# Patient Record
Sex: Female | Born: 1985
Health system: Southern US, Community
[De-identification: ages and names within clinical notes are randomized; demographics above are authoritative.]

## PROBLEM LIST (undated history)

## (undated) DIAGNOSIS — K589 Irritable bowel syndrome without diarrhea: Secondary | ICD-10-CM

## (undated) DIAGNOSIS — F32A Depression, unspecified: Secondary | ICD-10-CM

## (undated) DIAGNOSIS — F329 Major depressive disorder, single episode, unspecified: Secondary | ICD-10-CM

## (undated) HISTORY — DX: Irritable bowel syndrome, unspecified: K58.9

## (undated) HISTORY — DX: Depression, unspecified: F32.A

## (undated) HISTORY — DX: Major depressive disorder, single episode, unspecified: F32.9

## (undated) HISTORY — PX: COLPOSCOPY: SHX161

---

## 2009-08-30 ENCOUNTER — Ambulatory Visit: Payer: Self-pay | Admitting: Obstetrics and Gynecology

## 2009-09-19 ENCOUNTER — Ambulatory Visit: Payer: Self-pay | Admitting: Obstetrics & Gynecology

## 2009-09-19 ENCOUNTER — Other Ambulatory Visit: Admission: RE | Admit: 2009-09-19 | Discharge: 2009-09-19 | Payer: Self-pay | Admitting: Obstetrics and Gynecology

## 2009-10-10 ENCOUNTER — Ambulatory Visit: Payer: Self-pay | Admitting: Obstetrics and Gynecology

## 2010-04-10 ENCOUNTER — Ambulatory Visit: Payer: Self-pay | Admitting: Obstetrics and Gynecology

## 2010-09-30 ENCOUNTER — Other Ambulatory Visit: Payer: Self-pay | Admitting: Physician Assistant

## 2010-09-30 ENCOUNTER — Ambulatory Visit: Payer: Self-pay | Admitting: Occupational Therapy

## 2010-09-30 DIAGNOSIS — R87619 Unspecified abnormal cytological findings in specimens from cervix uteri: Secondary | ICD-10-CM

## 2010-11-08 NOTE — Progress Notes (Signed)
Monica Meyer, SON NO.:  [id number]  MEDICAL RECORD NO.:  [id number]           PATIENT TYPE:  A  LOCATION:  [facility]                   FACILITY:  [facility]  PHYSICIAN:  Maylon Cos, CNM    DATE OF BIRTH:  08-12-1986  DATE OF SERVICE:                                 CLINIC NOTE  [name] is a 25 year old G1, P0-0-1-0.  She was here to have a repeat Pap status post LEEP procedure.  This is her second Pap.  She denies any other pains or problems.  Denies any discharge or bleeding, is otherwise states that she is healthy.  PHYSICAL EXAMINATION:  GU:  Shows normal rugae, normal pink cervix with mild friability, normal discharge. HEART:  Normal. LUNGS:  Normal. VITAL SIGNS:  Temperature 98.9, pulse 101, blood pressure 114/74, weight 110.9.  ASSESSMENT AND PLAN:  This is a 25 year old G1, P0-0-1-0 patient who is here to repeat her Pap.  Last Pap was negative.  She is to return in 6 months to review the result of this Pap and/or get a third Pap.  Vitamin supplement with folic acid were also discussed with the patient.  The patient will continue Depo shot as discussed.    ______________________________ Maylon Cos, CNM   ______________________________ Maylon Cos, CNM   SS/MEDQ  D:  09/30/2010  T:  10/01/2010  Job:  [id number]

## 2011-03-13 ENCOUNTER — Ambulatory Visit: Payer: Self-pay | Admitting: Obstetrics & Gynecology

## 2014-06-06 ENCOUNTER — Encounter: Payer: Self-pay | Admitting: Family Medicine

## 2014-06-06 ENCOUNTER — Ambulatory Visit (INDEPENDENT_AMBULATORY_CARE_PROVIDER_SITE_OTHER): Payer: BC Managed Care – PPO | Admitting: Family Medicine

## 2014-06-06 VITALS — BP 122/68 | HR 76 | Temp 98.2°F | Resp 14 | Ht 63.0 in | Wt 112.0 lb

## 2014-06-06 DIAGNOSIS — F329 Major depressive disorder, single episode, unspecified: Secondary | ICD-10-CM | POA: Insufficient documentation

## 2014-06-06 DIAGNOSIS — G4700 Insomnia, unspecified: Secondary | ICD-10-CM

## 2014-06-06 DIAGNOSIS — F411 Generalized anxiety disorder: Secondary | ICD-10-CM

## 2014-06-06 DIAGNOSIS — F331 Major depressive disorder, recurrent, moderate: Secondary | ICD-10-CM

## 2014-06-06 DIAGNOSIS — F5104 Psychophysiologic insomnia: Secondary | ICD-10-CM | POA: Insufficient documentation

## 2014-06-06 MED ORDER — ZOLPIDEM TARTRATE 10 MG PO TABS: 10.0000 mg | ORAL_TABLET | Freq: Every evening | ORAL | Status: DC | PRN

## 2014-06-06 MED ORDER — ESCITALOPRAM OXALATE 10 MG PO TABS: 10.0000 mg | ORAL_TABLET | Freq: Every day | ORAL | Status: DC

## 2014-06-06 NOTE — Progress Notes (Signed)
Patient ID: Monica Meyer, female   DOB: 1986-05-17, 28 y.o.   MRN: [id number]   Subjective:    Patient ID: Monica Meyer, female    DOB: 1986-05-17, 28 y.o.   MRN: [id number]  Patient presents for Increased Anxiety/ Stress  patient here to discuss depression and anxiety. She was initially diagnosed with anxiety and depression at the age of 28 or 5017 that time she was put on Zoloft she thinks. She went off the medication because she was pregnant however she had an abortion. She was seen by therapist over the past few years but never felt that this helps. Her mother also suffers with depression. Over the past couple years she has had depression and anxiety which has been untreated. More recently she's had episodes of chest discomfort when she gets stressed out she denies any overt panic attacks actually did not do the definition of this. She also has very poor sleep and typically takes Tylenol PM as needed. She will like to try medications to help with her anxiety and depression. She is fearful of being alone and she is currently 28 years old no children and not in a stable relationship. She also has a lot of stress at her job which she works at [employer] and she still lives with her mother. She denies hallucinations or suicidal ideations. She does exercise on a regular basis to help with her overall health On her chest hurts she will typically have discomfort that feels more like a tightness that lasts all day long until she goes to sleep. She's not had any acid reflux no nausea vomiting associated no shortness of breath no upper respiratory symptoms  [facility] Review Of Systems:  GEN- denies fatigue, fever, weight loss,weakness, recent illness HEENT- denies eye drainage, change in vision, nasal discharge, CVS-+chest pain, palpitations RESP- denies SOB, cough, wheeze ABD- denies N/V, change in stools, abd pain GU- denies dysuria, hematuria, dribbling, incontinence MSK- denies  joint pain, muscle aches, injury Neuro- denies headache, dizziness, syncope, seizure activity       Objective:    BP 122/68  Pulse 76  Temp(Src) 98.2 F (36.8 C) (Oral)  Resp 14  Ht 5\' 3"  (1.6 m)  Wt 112 lb (50.803 kg)  BMI 19.84 kg/m2 GEN- NAD, alert and oriented x3 HEENT- PERRL, EOMI, non injected sclera, pink conjunctiva, MMM, oropharynx clear Neck- Supple, no thyromegaly CVS- RRR, no murmur RESP-CTAB ABD-NABS,soft,NT,ND Psych- normal affect and mood, no SI, normal speech,well groomed EXT- No edema Pulses- Radial 2+        Assessment & Plan:      Problem List Items Addressed This Visit   None      Note: This dictation was prepared with Dragon dictation along with smaller phrase technology. Any transcriptional errors that result from this process are unintentional.

## 2014-06-06 NOTE — Assessment & Plan Note (Signed)
We'll start her on Lexapro 10 mg once a day. We discussed the side effects of the medication. I think she'll benefit from treatment. She declines going to therapy at this time as it has not helped in the past. I see no red flags on examination today.  She does drink alcohol we did discuss not taking his medications with alcohol

## 2014-06-06 NOTE — Assessment & Plan Note (Signed)
Per above Also give her Ambien as needed for sleep until the Lexapro gets in her system

## 2014-06-06 NOTE — Patient Instructions (Signed)
Start the lexapro once a day  Take ambein as needed  F/U 4 weeks

## 2014-06-14 ENCOUNTER — Telehealth: Payer: Self-pay | Admitting: Physician Assistant

## 2014-06-14 MED ORDER — SERTRALINE HCL 50 MG PO TABS: 50.0000 mg | ORAL_TABLET | Freq: Every day | ORAL | Status: DC

## 2014-06-14 NOTE — Telephone Encounter (Signed)
D/c the lexpro Wait 1 week, then start zoloft 50mg  once a day, she has taken in the  Past should not have these effects

## 2014-06-14 NOTE — Telephone Encounter (Signed)
Prescription sent to pharmacy.   Call placed to patient. LMTRC.  

## 2014-06-14 NOTE — Telephone Encounter (Signed)
Patient returned call and made aware.

## 2014-06-14 NOTE — Telephone Encounter (Signed)
Call placed to patient to inquire.  Reports that she began taking Lexapro on 06/07/2014. States that she began taking it at bedtime in case it made her sleepy or dizzy. Reports that she did note some dizziness after taking it, but the dizziness continued throughout the next day.   Also noted that she had some mouth and tongue numbness, but denied any SOB or difficulty breathing. States that she continues to have mild chest pain, but she was having the chest pain before she began medication.   States that today she feels nauseous and has abdominal cramping as well.   MD please advise.

## 2014-06-14 NOTE — Telephone Encounter (Signed)
[id number] Patient is calling to let you know that the generic lexapro that we prescribed for her is making her feel horrible  Would like a call to see what she can do

## 2014-07-11 ENCOUNTER — Ambulatory Visit: Payer: BC Managed Care – PPO | Admitting: Family Medicine

## 2014-11-24 ENCOUNTER — Ambulatory Visit
Admission: RE | Admit: 2014-11-24 | Discharge: 2014-11-24 | Disposition: A | Discharge status: Home / Self Care | Payer: BLUE CROSS/BLUE SHIELD | Source: Ambulatory Visit | Attending: Family Medicine | Admitting: Family Medicine

## 2014-11-24 ENCOUNTER — Encounter: Payer: Self-pay | Admitting: Family Medicine

## 2014-11-24 ENCOUNTER — Ambulatory Visit (INDEPENDENT_AMBULATORY_CARE_PROVIDER_SITE_OTHER): Payer: BLUE CROSS/BLUE SHIELD | Admitting: Family Medicine

## 2014-11-24 DIAGNOSIS — M545 Low back pain, unspecified: Secondary | ICD-10-CM

## 2014-11-24 DIAGNOSIS — M546 Pain in thoracic spine: Secondary | ICD-10-CM

## 2014-11-24 MED ORDER — MELOXICAM 7.5 MG PO TABS: 7.5000 mg | ORAL_TABLET | Freq: Every day | ORAL | Status: DC

## 2014-11-24 MED ORDER — CYCLOBENZAPRINE HCL 5 MG PO TABS: 5.0000 mg | ORAL_TABLET | Freq: Three times a day (TID) | ORAL | Status: DC | PRN

## 2014-11-24 NOTE — Patient Instructions (Signed)
Take anti-inflammatory Take muscle relaxer Okay to use heating pad Get xrays done Try Melatonin as  needed F/U in 3-4 weeks if not improved
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
## 2014-11-27 ENCOUNTER — Encounter: Payer: Self-pay | Admitting: Physician Assistant

## 2014-12-01 ENCOUNTER — Telehealth: Payer: Self-pay | Admitting: Physician Assistant

## 2014-12-01 NOTE — Telephone Encounter (Signed)
Okay to return on Monday, she needs to start slow and build back up, go easy on weights
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
## 2014-12-01 NOTE — Telephone Encounter (Signed)
Pt called with provider recommendations 

## 2014-12-04 ENCOUNTER — Telehealth: Payer: Self-pay | Admitting: Physician Assistant

## 2014-12-04 NOTE — Telephone Encounter (Signed)
Patient brought fmla forms in to be filled out on 12/04/2014
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
## 2014-12-06 NOTE — Telephone Encounter (Signed)
Received FMLA ppw on my desk, called pt vm not set up, I need to know what the hours pt works and job duties

## 2014-12-07 NOTE — Telephone Encounter (Signed)
Received PPW back on my desk, called pt to make aware of FMLA is ready to be faxed or picked up and to pay fee of 20 dollars I asked pt if she wanted forms to be faxed and stated yes, I informed her that she had to pay the fee first before I can fax over and she stated that she was at work and she will call later to pay, I told her that was fine but can not fax until I know that she has paid.Pt will call back to make pament

## 2014-12-13 ENCOUNTER — Telehealth: Payer: Self-pay | Admitting: Physician Assistant

## 2014-12-13 ENCOUNTER — Other Ambulatory Visit: Payer: Self-pay | Admitting: Family Medicine

## 2014-12-13 NOTE — Telephone Encounter (Signed)
Refill appropriate and filled per protocol. 

## 2014-12-13 NOTE — Telephone Encounter (Signed)
Patient is just letting dr Jeanice Limdurham know that her back was hurting today, she said it was getting better but today is different.  Would like for you to call her if any questions to [phone]

## 2014-12-13 NOTE — Telephone Encounter (Signed)
Will forward to Dr. Jeanice Limurham. She saw pt for LOV, which was for this problem.  I dont know why my name is attached to last few phone messages--I wasnot involve in those.

## 2014-12-13 NOTE — Telephone Encounter (Signed)
Agree with above, xrays negative, she can use meds, heat

## 2014-12-13 NOTE — Telephone Encounter (Signed)
Returned call to patient.   Reports that she went about x1 week without back pain. She stopped taking Flexeril and Mobic.   States that she did more lifting while at work on 12/12/2014, and woke up this morning with pain in her back.   Advised to resume QD Mobic and PRN Flexeril. Advised to F/U with phone call if pain is not relieved.   MD to be made aware.

## 2015-01-01 ENCOUNTER — Encounter: Payer: Self-pay | Admitting: Physician Assistant

## 2015-02-27 ENCOUNTER — Encounter: Payer: Self-pay | Admitting: Physician Assistant

## 2015-11-26 DIAGNOSIS — F339 Major depressive disorder, recurrent, unspecified: Secondary | ICD-10-CM | POA: Diagnosis not present

## 2015-11-26 DIAGNOSIS — G4700 Insomnia, unspecified: Secondary | ICD-10-CM | POA: Diagnosis not present
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
## 2016-01-23 DIAGNOSIS — Z3042 Encounter for surveillance of injectable contraceptive: Secondary | ICD-10-CM | POA: Diagnosis not present

## 2016-01-23 DIAGNOSIS — Z3009 Encounter for other general counseling and advice on contraception: Secondary | ICD-10-CM | POA: Diagnosis not present

## 2016-03-04 DIAGNOSIS — Z01419 Encounter for gynecological examination (general) (routine) without abnormal findings: Secondary | ICD-10-CM | POA: Diagnosis not present

## 2016-03-04 DIAGNOSIS — Z3009 Encounter for other general counseling and advice on contraception: Secondary | ICD-10-CM | POA: Diagnosis not present

## 2016-03-04 DIAGNOSIS — Z3042 Encounter for surveillance of injectable contraceptive: Secondary | ICD-10-CM | POA: Diagnosis not present

## 2016-04-14 ENCOUNTER — Ambulatory Visit: Payer: Self-pay | Admitting: Physician Assistant

## 2016-04-15 DIAGNOSIS — Z3042 Encounter for surveillance of injectable contraceptive: Secondary | ICD-10-CM | POA: Diagnosis not present

## 2016-04-15 DIAGNOSIS — Z3009 Encounter for other general counseling and advice on contraception: Secondary | ICD-10-CM | POA: Diagnosis not present

## 2016-09-15 ENCOUNTER — Telehealth: Payer: Self-pay | Admitting: Family Medicine

## 2016-09-15 NOTE — Telephone Encounter (Signed)
Pt states her lymph node was swollen but it is not now, wants to know if it happens again if she should come in. Please call pt back [phone].

## 2016-09-15 NOTE — Telephone Encounter (Signed)
Call placed to patient. No answer. No VM.  

## 2016-09-16 NOTE — Telephone Encounter (Signed)
Call placed to patient. No answer. No VM.  

## 2016-09-18 NOTE — Telephone Encounter (Signed)
Multiple calls placed to patient with no answer and no return call.   Message to be closed.  

## 2016-10-13 DIAGNOSIS — Z3042 Encounter for surveillance of injectable contraceptive: Secondary | ICD-10-CM | POA: Diagnosis not present

## 2016-10-13 DIAGNOSIS — Z3009 Encounter for other general counseling and advice on contraception: Secondary | ICD-10-CM | POA: Diagnosis not present

## 2017-01-13 DIAGNOSIS — Z3009 Encounter for other general counseling and advice on contraception: Secondary | ICD-10-CM | POA: Diagnosis not present

## 2017-01-13 DIAGNOSIS — Z3042 Encounter for surveillance of injectable contraceptive: Secondary | ICD-10-CM | POA: Diagnosis not present

## 2017-02-02 ENCOUNTER — Ambulatory Visit (INDEPENDENT_AMBULATORY_CARE_PROVIDER_SITE_OTHER): Payer: BLUE CROSS/BLUE SHIELD | Admitting: Physician Assistant

## 2017-02-02 ENCOUNTER — Encounter: Payer: Self-pay | Admitting: Physician Assistant

## 2017-02-02 VITALS — BP 102/78 | HR 60 | Temp 98.3°F | Resp 14 | Ht 63.0 in | Wt 116.8 lb

## 2017-02-02 DIAGNOSIS — R630 Anorexia: Secondary | ICD-10-CM

## 2017-02-02 DIAGNOSIS — R6881 Early satiety: Secondary | ICD-10-CM

## 2017-02-02 DIAGNOSIS — R197 Diarrhea, unspecified: Secondary | ICD-10-CM | POA: Diagnosis not present

## 2017-02-02 LAB — CBC WITH DIFFERENTIAL/PLATELET
BASOS ABS: 0 {cells}/uL (ref 0–200)
BASOS PCT: 0 %
EOS PCT: 3 %
Eosinophils Absolute: 177 cells/uL (ref 15–500)
HCT: 40.7 % (ref 35.0–45.0)
HEMOGLOBIN: 13.5 g/dL (ref 12.0–15.0)
Lymphocytes Relative: 37 %
Lymphs Abs: 2183 cells/uL (ref 850–3900)
MCH: 29 pg (ref 27.0–33.0)
MCHC: 33.2 g/dL (ref 32.0–36.0)
MCV: 87.3 fL (ref 80.0–100.0)
MONOS PCT: 5 %
MPV: 8.5 fL (ref 7.5–12.5)
Monocytes Absolute: 295 cells/uL (ref 200–950)
NEUTROS ABS: 3245 {cells}/uL (ref 1500–7800)
Neutrophils Relative %: 55 %
Platelets: 310 10*3/uL (ref 140–400)
RBC: 4.66 MIL/uL (ref 3.80–5.10)
RDW: 13.2 % (ref 11.0–15.0)
WBC: 5.9 10*3/uL (ref 3.8–10.8)

## 2017-02-02 NOTE — Progress Notes (Addendum)
Patient ID: Monica SnellenBrionni Macht MRN: [id number], DOB: 10/12/1985, 31 y.o. Date of Encounter: 02/02/2017, 11:56 AM    Chief Complaint:  Chief Complaint  Patient presents with  . trouble eating    x2 months     HPI: 31 y.o. year old female presents with above.  I reviewed her office visit note from 06/06/14 which discusses her history of anxiety and depression. See that note for further information regarding that.  Today she states that she has been noticing the following symptoms for almost 2 months now: Says that she "has always been underweight". Says that at one point recently when she went back for her Depo Provera shot she had gained 10 pounds since the prior depo shot 3 months prior. Did not know why or how she had gained that weight. However now feels like she is probably losing some of that weight again because she has had decreased appetite.  Says that "they will order me 5 chicken nuggets and I will eat 2 of them and just can't eat anymore ". Says that she is not trying to lose weight. Asked if she feels that her anxiety or depression are affecting this.  She says that in the past if her anxiety or depression kicked in, then she would not want to eat-- but that would only last a couple of days and this is lasting a lot longer. Also she says that she is not feeling like she is having any increased anxiety or depression recently. Says that she is actually been laughing, etc. Says that when she eats it causes no pain. Says that she "gets that feeling like she just can't eat another bite ". Says it is happens with everything-- every type of food -- she feels this way.  Says that she has been on the Depo-Provera for over 7 years. The only other medicine she uses-- occasional ibuprofen and then Tylenol PM. Uses some over-the-counter medication for stuffy runny nose occasionally. No other medications no new medication changes.  She has no constipation. No hard stools no  straining. Says that on a regular basis generally her stools will be a mix of, regular and loose. However says that recently all stools have been diarrhea but still it's less than or equal to 2 per day.  She has had no abdominal pain. Is not having belching or a lot of gas.  No fevers or chills. No other symptoms. No other concerns to address today.     Home Meds:   Outpatient Medications Prior to Visit  Medication Sig Dispense Refill  . medroxyPROGESTERone (DEPO-PROVERA) 150 MG/ML injection Inject 150 mg into the muscle every 3 (three) months.    . cyclobenzaprine (FLEXERIL) 5 MG tablet Take 1 tablet (5 mg total) by mouth 3 (three) times daily as needed for muscle spasms. 30 tablet 1  . meloxicam (MOBIC) 7.5 MG tablet TAKE ONE TABLET BY MOUTH DAILY 30 tablet 3   No facility-administered medications prior to visit.     Allergies: No Known Allergies    Review of Systems: See HPI for pertinent ROS. All other ROS negative.    Physical Exam: Blood pressure 102/78, pulse 60, temperature 98.3 F (36.8 C), temperature source Oral, resp. rate 14, height 5\' 3"  (1.6 m), weight 116 lb 12.8 oz (53 kg)., Body mass index is 20.69 kg/m. General:  WNWD AAF. Appears in no acute distress. Neck: Supple. No thyromegaly. No lymphadenopathy. Lungs: Clear bilaterally to auscultation without wheezes, rales, or rhonchi. Breathing is  unlabored. Heart: Regular rhythm. No murmurs, rubs, or gallops. Abdomen: Soft, non-tender, non-distended with normoactive bowel sounds. No hepatomegaly. No rebound/guarding. No obvious abdominal masses. There is no area of tenderness with palpation. Msk:  Strength and tone normal for age. Extremities/Skin: Warm and dry.  Neuro: Alert and oriented X 3. Moves all extremities spontaneously. Gait is normal. CNII-XII grossly in tact. Psych:  Responds to questions appropriately with a normal affect.     ASSESSMENT AND PLAN:  31 y.o. year old female with   Obtain labs. Will  follow-up with her once a get these results. If these provide no answers then will refer to GI.  1. Anorexia Obtain labs. Will follow-up with her once a get these results. If these provide no answers then will refer to GI. - CBC with Differential/Platelet - COMPLETE METABOLIC PANEL WITH GFR - TSH - hCG, serum, qualitative - Celiac panel 10 - Ambulatory referral to Gastroenterology - HIV antibody  2. Early satiety - CBC with Differential/Platelet - COMPLETE METABOLIC PANEL WITH GFR - hCG, serum, qualitative - Celiac panel 10 - Ambulatory referral to Gastroenterology - HIV antibody  3. Diarrhea, unspecified type - CBC with Differential/Platelet - COMPLETE METABOLIC PANEL WITH GFR - Celiac panel 10 - Ambulatory referral to Gastroenterology - HIV antibody  --------------------------------------------ADDENDUM-----RECEIVED OV NOTE FROM DR. HUNG, GI---------------------------------------------- His assessment is irritable bowel syndrome with diarrhea. He felt that her symptoms were consistent with IBS. He plan to try her on Viberzi 100 g twice a day to see if this would help with her morning abdominal pain and diarrhea. He would reassess in one month. Also noted that he had the following information and his HPI--" when she lost her job, interestingly, her symptoms improved. She does notice that stress does worsen her symptoms. In the morning her abdominal symptoms improved with a bowel movement and it is diarrhea."   Signed, [address] [place], [place], [facility] 02/02/2017 11:56 AM

## 2017-02-03 LAB — HCG, SERUM, QUALITATIVE: Preg, Serum: NEGATIVE

## 2017-02-03 LAB — COMPLETE METABOLIC PANEL WITH GFR
ALT: 10 U/L (ref 6–29)
AST: 13 U/L (ref 10–30)
Albumin: 4.6 g/dL (ref 3.6–5.1)
Alkaline Phosphatase: 52 U/L (ref 33–115)
BILIRUBIN TOTAL: 0.6 mg/dL (ref 0.2–1.2)
BUN: 14 mg/dL (ref 7–25)
CO2: 25 mmol/L (ref 20–31)
Calcium: 9.3 mg/dL (ref 8.6–10.2)
Chloride: 107 mmol/L (ref 98–110)
Creat: 0.9 mg/dL (ref 0.50–1.10)
GFR, EST NON AFRICAN AMERICAN: 86 mL/min (ref >=60)
GFR, Est African American: >89 mL/min (ref >=60)
Glucose, Bld: 83 mg/dL (ref 70–99)
Potassium: 4.5 mmol/L (ref 3.5–5.3)
SODIUM: 139 mmol/L (ref 135–146)
Total Protein: 7.2 g/dL (ref 6.1–8.1)

## 2017-02-03 LAB — HIV ANTIBODY (ROUTINE TESTING W REFLEX): HIV 1&2 Ab, 4th Generation: NONREACTIVE

## 2017-02-03 LAB — TSH: TSH: 1.44 mIU/L

## 2017-02-06 LAB — CELIAC PNL 2 RFLX ENDOMYSIAL AB TTR
(tTG) Ab, IgA: <1 U/mL
(tTG) Ab, IgG: 4 U/mL
ENDOMYSIAL AB IGA: NEGATIVE
Gliadin(Deam) Ab,IgA: 18 U (ref <20)
Gliadin(Deam) Ab,IgG: 4 U (ref <20)
IMMUNOGLOBULIN A: 300 mg/dL (ref 81–463)

## 2017-02-23 DIAGNOSIS — K580 Irritable bowel syndrome with diarrhea: Secondary | ICD-10-CM | POA: Diagnosis not present

## 2017-04-22 DIAGNOSIS — Z3042 Encounter for surveillance of injectable contraceptive: Secondary | ICD-10-CM | POA: Diagnosis not present

## 2017-04-30 DIAGNOSIS — Z3042 Encounter for surveillance of injectable contraceptive: Secondary | ICD-10-CM | POA: Diagnosis not present

## 2017-04-30 DIAGNOSIS — Z3009 Encounter for other general counseling and advice on contraception: Secondary | ICD-10-CM | POA: Diagnosis not present

## 2017-04-30 DIAGNOSIS — Z3200 Encounter for pregnancy test, result unknown: Secondary | ICD-10-CM | POA: Diagnosis not present

## 2017-05-21 ENCOUNTER — Ambulatory Visit (INDEPENDENT_AMBULATORY_CARE_PROVIDER_SITE_OTHER): Payer: BLUE CROSS/BLUE SHIELD

## 2017-05-21 ENCOUNTER — Ambulatory Visit (INDEPENDENT_AMBULATORY_CARE_PROVIDER_SITE_OTHER): Payer: BLUE CROSS/BLUE SHIELD | Admitting: Orthopaedic Surgery

## 2017-05-21 DIAGNOSIS — M25561 Pain in right knee: Secondary | ICD-10-CM

## 2017-05-21 DIAGNOSIS — G8929 Other chronic pain: Secondary | ICD-10-CM

## 2017-05-21 NOTE — Progress Notes (Signed)
Office Visit Note   Patient: Monica Meyer           Date of Birth: 1986-01-12           MRN: [id number] Visit Date: 05/21/2017              Requested by: Dorena Bodo, PA-C [address], [place] [address] PCP: Dorena Bodo, PA-C   Assessment & Plan: Visit Diagnoses:  1. Chronic pain of right knee     Plan: For now the only thing I would suggest would be quad strengthening exercises as well as occasional over-the-counter anti-inflammatories or even glucosamine. We went over her x-rays and knee model and explained in detail the rationale but behind her knee pain. My next step would be to consider steroid injection in the knee if she was not getting better. She will take this into consideration and otherwise follow up as needed.  Follow-Up Instructions: Return if symptoms worsen or fail to improve.   Orders:  Orders Placed This Encounter  Procedures  . XR Knee 1-2 Views Right   No orders of the defined types were placed in this encounter.     Procedures: No procedures performed   Clinical Data: No additional findings.   Subjective: No chief complaint on file. The patient is a very pleasant 31 year old female who comes in with a 2-3 year history of right knee pain. She used to work out quite a bit but she did a lot of lunges and squats and never injured her knee but now she says the knee does ache if she's been sitting up for long period time which is been active. She sometimes gets a sense that is going to give way on her but it does not. She denies any locking catching or any specific injury to that knee. She says it does not swell on her either. She does not take any medications for this. 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 She has not been back to work in a very long period time due to pain in her knee. She says is not excruciating type of pain and she can deal with it but she wanted to make sure there is nothing structurally wrong with the  knee.  HPI  Review of Systems She denies any chest pain, short of breath, fever, chills, nausea, vomiting, headache  Objective: Vital Signs: There were no vitals taken for this visit.  Physical Exam He is alert and oriented 3 and in no acute distress. She does not walk with a limp. Ortho Exam On examination she is a thin individual who is physically fit. She's got good integrity of the quad muscles and calf muscles bilaterally. Neither knee has an effusion. Both right and left knees have full range of motion. The patellas track well on both knees with no significant crepitation. Her McMurray's and Lachman's exams are both negative on both right and left knees. Specialty Comments:  No specialty comments available.  Imaging: Xr Knee 1-2 Views Right  Result Date: 05/21/2017 2 views of the right knee show no effusion and no acute findings. There is no abnormal alignment. There is calcifications in the distal metaphyseal area but this appears chronic such as an enchondroma.    PMFS History: Patient Active Problem List   Diagnosis Date Noted  . MDD (major depressive disorder) 06/06/2014  . GAD (generalized anxiety disorder) 06/06/2014  . Chronic insomnia 06/06/2014   Past Medical History:  Diagnosis  Date  . Depression     Family History  Problem Relation Age of Onset  . Vision loss Mother   . Alcohol abuse Father   . Vision loss Father   . Learning disabilities Sister   . Mental retardation Sister   . Asthma Brother     Past Surgical History:  Procedure Laterality Date  . COLPOSCOPY     Social History   Occupational History  . Not on file.   Social History Main Topics  . Smoking status: Never Smoker  . Smokeless tobacco: Never Used  . Alcohol use 0.5 oz/week    1 drink(s) per week  . Drug use: No  . Sexual activity: Yes

## 2017-06-02 DIAGNOSIS — Z3042 Encounter for surveillance of injectable contraceptive: Secondary | ICD-10-CM | POA: Diagnosis not present

## 2017-07-23 DIAGNOSIS — Z3042 Encounter for surveillance of injectable contraceptive: Secondary | ICD-10-CM | POA: Diagnosis not present

## 2017-07-23 DIAGNOSIS — Z3009 Encounter for other general counseling and advice on contraception: Secondary | ICD-10-CM | POA: Diagnosis not present

## 2017-09-21 DIAGNOSIS — L739 Follicular disorder, unspecified: Secondary | ICD-10-CM | POA: Diagnosis not present

## 2017-09-21 DIAGNOSIS — L709 Acne, unspecified: Secondary | ICD-10-CM | POA: Diagnosis not present

## 2017-09-21 DIAGNOSIS — L089 Local infection of the skin and subcutaneous tissue, unspecified: Secondary | ICD-10-CM | POA: Diagnosis not present

## 2018-01-28 DIAGNOSIS — Z3042 Encounter for surveillance of injectable contraceptive: Secondary | ICD-10-CM | POA: Diagnosis not present

## 2018-01-28 DIAGNOSIS — Z3009 Encounter for other general counseling and advice on contraception: Secondary | ICD-10-CM | POA: Diagnosis not present

## 2018-03-24 DIAGNOSIS — K589 Irritable bowel syndrome without diarrhea: Secondary | ICD-10-CM | POA: Diagnosis not present

## 2018-04-22 DIAGNOSIS — Z3009 Encounter for other general counseling and advice on contraception: Secondary | ICD-10-CM | POA: Diagnosis not present

## 2018-04-22 DIAGNOSIS — Z3042 Encounter for surveillance of injectable contraceptive: Secondary | ICD-10-CM | POA: Diagnosis not present

## 2018-07-27 DIAGNOSIS — Z3009 Encounter for other general counseling and advice on contraception: Secondary | ICD-10-CM | POA: Diagnosis not present

## 2018-07-27 DIAGNOSIS — Z3042 Encounter for surveillance of injectable contraceptive: Secondary | ICD-10-CM | POA: Diagnosis not present

## 2018-09-09 DIAGNOSIS — F330 Major depressive disorder, recurrent, mild: Secondary | ICD-10-CM | POA: Diagnosis not present

## 2018-09-09 DIAGNOSIS — F339 Major depressive disorder, recurrent, unspecified: Secondary | ICD-10-CM | POA: Diagnosis not present

## 2018-09-09 DIAGNOSIS — G4700 Insomnia, unspecified: Secondary | ICD-10-CM | POA: Diagnosis not present

## 2018-09-27 DIAGNOSIS — F339 Major depressive disorder, recurrent, unspecified: Secondary | ICD-10-CM | POA: Diagnosis not present

## 2018-09-27 DIAGNOSIS — G4700 Insomnia, unspecified: Secondary | ICD-10-CM | POA: Diagnosis not present

## 2018-11-02 DIAGNOSIS — Z3042 Encounter for surveillance of injectable contraceptive: Secondary | ICD-10-CM | POA: Diagnosis not present

## 2018-11-02 DIAGNOSIS — Z0000 Encounter for general adult medical examination without abnormal findings: Secondary | ICD-10-CM | POA: Diagnosis not present

## 2018-11-02 DIAGNOSIS — Z113 Encounter for screening for infections with a predominantly sexual mode of transmission: Secondary | ICD-10-CM | POA: Diagnosis not present

## 2018-11-08 DIAGNOSIS — G4700 Insomnia, unspecified: Secondary | ICD-10-CM | POA: Diagnosis not present

## 2018-11-08 DIAGNOSIS — F339 Major depressive disorder, recurrent, unspecified: Secondary | ICD-10-CM | POA: Diagnosis not present

## 2018-11-08 DIAGNOSIS — K588 Other irritable bowel syndrome: Secondary | ICD-10-CM | POA: Diagnosis not present

## 2019-01-11 DIAGNOSIS — F329 Major depressive disorder, single episode, unspecified: Secondary | ICD-10-CM | POA: Diagnosis not present

## 2019-01-13 ENCOUNTER — Other Ambulatory Visit: Payer: Self-pay

## 2019-01-13 ENCOUNTER — Other Ambulatory Visit (HOSPITAL_COMMUNITY)
Admission: RE | Admit: 2019-01-13 | Discharge: 2019-01-13 | Disposition: A | Discharge status: Home / Self Care | Payer: BLUE CROSS/BLUE SHIELD | Source: Ambulatory Visit | Attending: Nurse Practitioner | Admitting: Nurse Practitioner

## 2019-01-13 DIAGNOSIS — Z7189 Other specified counseling: Secondary | ICD-10-CM | POA: Diagnosis not present

## 2019-01-13 DIAGNOSIS — Z6821 Body mass index (BMI) 21.0-21.9, adult: Secondary | ICD-10-CM | POA: Diagnosis not present

## 2019-01-13 DIAGNOSIS — N898 Other specified noninflammatory disorders of vagina: Secondary | ICD-10-CM | POA: Insufficient documentation

## 2019-01-13 DIAGNOSIS — B373 Candidiasis of vulva and vagina: Secondary | ICD-10-CM | POA: Diagnosis not present

## 2019-01-14 LAB — CERVICOVAGINAL ANCILLARY ONLY
Bacterial vaginitis: NEGATIVE
Candida vaginitis: POSITIVE — AB
Chlamydia: NEGATIVE
Neisseria Gonorrhea: NEGATIVE
Trichomonas: NEGATIVE

## 2019-01-26 DIAGNOSIS — F331 Major depressive disorder, recurrent, moderate: Secondary | ICD-10-CM | POA: Diagnosis not present

## 2019-01-31 DIAGNOSIS — Z3042 Encounter for surveillance of injectable contraceptive: Secondary | ICD-10-CM | POA: Diagnosis not present

## 2019-01-31 DIAGNOSIS — Z3009 Encounter for other general counseling and advice on contraception: Secondary | ICD-10-CM | POA: Diagnosis not present

## 2019-02-09 DIAGNOSIS — F331 Major depressive disorder, recurrent, moderate: Secondary | ICD-10-CM | POA: Diagnosis not present

## 2019-02-22 DIAGNOSIS — F339 Major depressive disorder, recurrent, unspecified: Secondary | ICD-10-CM | POA: Diagnosis not present

## 2019-02-22 DIAGNOSIS — G4700 Insomnia, unspecified: Secondary | ICD-10-CM | POA: Diagnosis not present

## 2019-04-06 DIAGNOSIS — F332 Major depressive disorder, recurrent severe without psychotic features: Secondary | ICD-10-CM | POA: Diagnosis not present

## 2019-04-13 DIAGNOSIS — F332 Major depressive disorder, recurrent severe without psychotic features: Secondary | ICD-10-CM | POA: Diagnosis not present

## 2019-04-14 DIAGNOSIS — F332 Major depressive disorder, recurrent severe without psychotic features: Secondary | ICD-10-CM | POA: Diagnosis not present

## 2019-04-15 DIAGNOSIS — F332 Major depressive disorder, recurrent severe without psychotic features: Secondary | ICD-10-CM | POA: Diagnosis not present

## 2019-04-18 DIAGNOSIS — F332 Major depressive disorder, recurrent severe without psychotic features: Secondary | ICD-10-CM | POA: Diagnosis not present

## 2019-04-19 DIAGNOSIS — F332 Major depressive disorder, recurrent severe without psychotic features: Secondary | ICD-10-CM | POA: Diagnosis not present

## 2019-04-20 DIAGNOSIS — F332 Major depressive disorder, recurrent severe without psychotic features: Secondary | ICD-10-CM | POA: Diagnosis not present

## 2019-04-21 DIAGNOSIS — F332 Major depressive disorder, recurrent severe without psychotic features: Secondary | ICD-10-CM | POA: Diagnosis not present

## 2019-04-22 DIAGNOSIS — F332 Major depressive disorder, recurrent severe without psychotic features: Secondary | ICD-10-CM | POA: Diagnosis not present

## 2019-04-26 DIAGNOSIS — F332 Major depressive disorder, recurrent severe without psychotic features: Secondary | ICD-10-CM | POA: Diagnosis not present

## 2019-04-27 DIAGNOSIS — F332 Major depressive disorder, recurrent severe without psychotic features: Secondary | ICD-10-CM | POA: Diagnosis not present

## 2019-04-28 DIAGNOSIS — F332 Major depressive disorder, recurrent severe without psychotic features: Secondary | ICD-10-CM | POA: Diagnosis not present

## 2019-04-29 DIAGNOSIS — F332 Major depressive disorder, recurrent severe without psychotic features: Secondary | ICD-10-CM | POA: Diagnosis not present

## 2019-05-02 DIAGNOSIS — F332 Major depressive disorder, recurrent severe without psychotic features: Secondary | ICD-10-CM | POA: Diagnosis not present

## 2019-05-02 DIAGNOSIS — Z3009 Encounter for other general counseling and advice on contraception: Secondary | ICD-10-CM | POA: Diagnosis not present

## 2019-05-02 DIAGNOSIS — Z3042 Encounter for surveillance of injectable contraceptive: Secondary | ICD-10-CM | POA: Diagnosis not present

## 2019-05-03 DIAGNOSIS — F332 Major depressive disorder, recurrent severe without psychotic features: Secondary | ICD-10-CM | POA: Diagnosis not present

## 2019-05-05 DIAGNOSIS — F332 Major depressive disorder, recurrent severe without psychotic features: Secondary | ICD-10-CM | POA: Diagnosis not present

## 2019-05-06 DIAGNOSIS — F332 Major depressive disorder, recurrent severe without psychotic features: Secondary | ICD-10-CM | POA: Diagnosis not present

## 2019-05-10 DIAGNOSIS — F332 Major depressive disorder, recurrent severe without psychotic features: Secondary | ICD-10-CM | POA: Diagnosis not present

## 2019-05-11 DIAGNOSIS — F332 Major depressive disorder, recurrent severe without psychotic features: Secondary | ICD-10-CM | POA: Diagnosis not present

## 2019-05-12 DIAGNOSIS — F332 Major depressive disorder, recurrent severe without psychotic features: Secondary | ICD-10-CM | POA: Diagnosis not present

## 2019-05-13 DIAGNOSIS — F332 Major depressive disorder, recurrent severe without psychotic features: Secondary | ICD-10-CM | POA: Diagnosis not present

## 2019-05-16 DIAGNOSIS — F332 Major depressive disorder, recurrent severe without psychotic features: Secondary | ICD-10-CM | POA: Diagnosis not present

## 2019-05-17 DIAGNOSIS — F332 Major depressive disorder, recurrent severe without psychotic features: Secondary | ICD-10-CM | POA: Diagnosis not present

## 2019-05-18 DIAGNOSIS — F332 Major depressive disorder, recurrent severe without psychotic features: Secondary | ICD-10-CM | POA: Diagnosis not present

## 2019-05-19 DIAGNOSIS — F332 Major depressive disorder, recurrent severe without psychotic features: Secondary | ICD-10-CM | POA: Diagnosis not present

## 2019-05-20 DIAGNOSIS — F332 Major depressive disorder, recurrent severe without psychotic features: Secondary | ICD-10-CM | POA: Diagnosis not present

## 2019-05-23 DIAGNOSIS — F332 Major depressive disorder, recurrent severe without psychotic features: Secondary | ICD-10-CM | POA: Diagnosis not present

## 2019-05-25 DIAGNOSIS — F332 Major depressive disorder, recurrent severe without psychotic features: Secondary | ICD-10-CM | POA: Diagnosis not present

## 2019-05-26 DIAGNOSIS — F332 Major depressive disorder, recurrent severe without psychotic features: Secondary | ICD-10-CM | POA: Diagnosis not present

## 2019-05-27 DIAGNOSIS — F332 Major depressive disorder, recurrent severe without psychotic features: Secondary | ICD-10-CM | POA: Diagnosis not present

## 2019-05-30 DIAGNOSIS — F332 Major depressive disorder, recurrent severe without psychotic features: Secondary | ICD-10-CM | POA: Diagnosis not present

## 2019-06-01 DIAGNOSIS — F332 Major depressive disorder, recurrent severe without psychotic features: Secondary | ICD-10-CM | POA: Diagnosis not present

## 2019-06-01 DIAGNOSIS — K589 Irritable bowel syndrome without diarrhea: Secondary | ICD-10-CM | POA: Diagnosis not present

## 2019-06-03 DIAGNOSIS — F332 Major depressive disorder, recurrent severe without psychotic features: Secondary | ICD-10-CM | POA: Diagnosis not present

## 2019-06-06 DIAGNOSIS — F332 Major depressive disorder, recurrent severe without psychotic features: Secondary | ICD-10-CM | POA: Diagnosis not present

## 2019-06-08 DIAGNOSIS — F332 Major depressive disorder, recurrent severe without psychotic features: Secondary | ICD-10-CM | POA: Diagnosis not present

## 2019-06-10 DIAGNOSIS — F332 Major depressive disorder, recurrent severe without psychotic features: Secondary | ICD-10-CM | POA: Diagnosis not present

## 2019-06-13 DIAGNOSIS — F332 Major depressive disorder, recurrent severe without psychotic features: Secondary | ICD-10-CM | POA: Diagnosis not present

## 2019-06-16 ENCOUNTER — Other Ambulatory Visit: Payer: Self-pay | Admitting: Gastroenterology

## 2019-06-16 DIAGNOSIS — R109 Unspecified abdominal pain: Secondary | ICD-10-CM

## 2019-06-22 ENCOUNTER — Other Ambulatory Visit: Payer: BLUE CROSS/BLUE SHIELD

## 2019-06-22 ENCOUNTER — Inpatient Hospital Stay: Admission: RE | Admit: 2019-06-22 | Payer: BC Managed Care – PPO | Source: Ambulatory Visit

## 2019-06-23 DIAGNOSIS — Z8719 Personal history of other diseases of the digestive system: Secondary | ICD-10-CM | POA: Diagnosis not present

## 2019-07-07 DIAGNOSIS — F339 Major depressive disorder, recurrent, unspecified: Secondary | ICD-10-CM | POA: Diagnosis not present

## 2019-07-07 DIAGNOSIS — Z7189 Other specified counseling: Secondary | ICD-10-CM | POA: Diagnosis not present

## 2019-07-19 ENCOUNTER — Other Ambulatory Visit: Payer: Self-pay | Admitting: Nurse Practitioner

## 2019-07-19 ENCOUNTER — Other Ambulatory Visit (HOSPITAL_COMMUNITY)
Admission: RE | Admit: 2019-07-19 | Discharge: 2019-07-19 | Disposition: A | Discharge status: Home / Self Care | Payer: BC Managed Care – PPO | Source: Ambulatory Visit | Attending: Nurse Practitioner | Admitting: Nurse Practitioner

## 2019-07-19 DIAGNOSIS — N898 Other specified noninflammatory disorders of vagina: Secondary | ICD-10-CM | POA: Insufficient documentation

## 2019-07-19 DIAGNOSIS — Z113 Encounter for screening for infections with a predominantly sexual mode of transmission: Secondary | ICD-10-CM | POA: Diagnosis not present

## 2019-07-19 DIAGNOSIS — Z719 Counseling, unspecified: Secondary | ICD-10-CM | POA: Diagnosis not present

## 2019-07-25 LAB — MOLECULAR ANCILLARY ONLY???
Candida Glabrata: NEGATIVE
Candida Vaginitis: NEGATIVE
Comment: NEGATIVE
Comment: NEGATIVE

## 2019-07-25 LAB — MOLECULAR ANCILLARY ONLY
Bacterial Vaginitis (gardnerella): POSITIVE — AB
Chlamydia: NEGATIVE
Comment: NEGATIVE
Comment: NEGATIVE
Comment: NEGATIVE
Comment: NORMAL
Neisseria Gonorrhea: NEGATIVE
Trichomonas: NEGATIVE

## 2019-08-03 DIAGNOSIS — F332 Major depressive disorder, recurrent severe without psychotic features: Secondary | ICD-10-CM | POA: Diagnosis not present

## 2019-08-08 DIAGNOSIS — Z3042 Encounter for surveillance of injectable contraceptive: Secondary | ICD-10-CM | POA: Diagnosis not present

## 2019-08-08 DIAGNOSIS — Z3009 Encounter for other general counseling and advice on contraception: Secondary | ICD-10-CM | POA: Diagnosis not present

## 2020-11-27 ENCOUNTER — Other Ambulatory Visit: Payer: Self-pay | Admitting: Nurse Practitioner

## 2020-11-27 ENCOUNTER — Other Ambulatory Visit (HOSPITAL_COMMUNITY)
Admission: RE | Admit: 2020-11-27 | Discharge: 2020-11-27 | Disposition: A | Discharge status: Home / Self Care | Payer: BC Managed Care – PPO | Source: Ambulatory Visit | Attending: Nurse Practitioner | Admitting: Nurse Practitioner

## 2020-11-27 DIAGNOSIS — N898 Other specified noninflammatory disorders of vagina: Secondary | ICD-10-CM | POA: Insufficient documentation

## 2020-11-28 LAB — MOLECULAR ANCILLARY ONLY
Bacterial Vaginitis (gardnerella): POSITIVE — AB
Candida Glabrata: NEGATIVE
Candida Vaginitis: POSITIVE — AB
Chlamydia: NEGATIVE
Comment: NEGATIVE
Comment: NEGATIVE
Comment: NEGATIVE
Comment: NEGATIVE
Comment: NEGATIVE
Comment: NORMAL
Neisseria Gonorrhea: NEGATIVE
Trichomonas: NEGATIVE

## 2021-12-05 ENCOUNTER — Ambulatory Visit (INDEPENDENT_AMBULATORY_CARE_PROVIDER_SITE_OTHER): Payer: BC Managed Care – PPO | Admitting: Adult Health

## 2021-12-05 ENCOUNTER — Encounter: Payer: Self-pay | Admitting: Adult Health

## 2021-12-05 VITALS — BP 111/77 | HR 78 | Ht 62.0 in | Wt 148.0 lb

## 2021-12-05 DIAGNOSIS — G4700 Insomnia, unspecified: Secondary | ICD-10-CM | POA: Diagnosis not present

## 2021-12-05 DIAGNOSIS — F329 Major depressive disorder, single episode, unspecified: Secondary | ICD-10-CM | POA: Diagnosis not present

## 2021-12-05 DIAGNOSIS — F411 Generalized anxiety disorder: Secondary | ICD-10-CM | POA: Diagnosis not present

## 2021-12-05 MED ORDER — TRAZODONE HCL 50 MG PO TABS: 50.0000 mg | ORAL_TABLET | Freq: Every day | ORAL | 2 refills | Status: DC

## 2021-12-05 MED ORDER — CARIPRAZINE HCL 1.5 MG PO CAPS: 1.5000 mg | ORAL_CAPSULE | Freq: Every day | ORAL | 2 refills | Status: DC

## 2021-12-05 NOTE — Progress Notes (Signed)
[facility] MD/PA/NP Initial Note ? ?12/05/2021 2:46 PM ?Monica Meyer  ?MRN:  [id number] ? ?Chief Complaint:  ? ?HPI: ? ?Patient seen today for initial psychiatric evaluation. ? ?Referred by therapist. ? ?Describes mood today as "ok". Pleasant. Tearful at times. Mood symptoms - reports depression and irritability. Feels anxious at times. 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 Likes things neat and even numbers, but can also be ok If things are out of order. Mood lower overall - fluctuates. Has periods of elevated moods. Lost friends and lost jobs because of it. Has been working with her GP - Alen Blew for mental health issues. Has tried a few others medications, without relief of symptoms. Genesight testing completed. Moved out on her own recently and has been trying to adjust to being on her own. Has some friends she spends time with. . Putting a wedge in between others. Taking medications as prescribed.  ?Energy levels lower. Active, does not have a regular exercise routine.   ?Enjoys some usual interests and activities. Single. Lives alone. Family in [place].Spending time with family. ?Appetite adequate. Weight gain. ?Sleeps well most nights. Averages 5 to 7 hours. ?Focus and concentration difficulties. Completing tasks. Managing aspects of household. Work at [employer]. ?Denies SI or HI.  ?Denies AH or VH. ?Denies self harm ?THC - daily use ?ETOH use ? ?Previous medication trials:  Zoloft, Lexapro, Viibryd, Rexulti, and others.  ? ?Visit Diagnosis:  ?  ICD-10-CM   ?1. Insomnia, unspecified type  G47.00 traZODone (DESYREL) 50 MG tablet  ?  ?2. Current episode of major depressive disorder without prior episode, unspecified depression episode severity  F32.9 cariprazine (VRAYLAR) 1.5 MG capsule  ?  ? ? ?Past Psychiatric History: Denies psychiatric hospitalization.  ? ?Past Medical History:  ?Past Medical History:  ?Diagnosis Date  ? Depression   ?  ?Past Surgical History:  ?Procedure Laterality Date  ? COLPOSCOPY     ? ? ?Family Psychiatric History: Denies any family history of mental illness.  ? ?Family History:  ?Family History  ?Problem Relation Age of Onset  ? Vision loss Mother   ? Alcohol abuse Father   ? Vision loss Father   ? Learning disabilities Sister   ? Mental retardation Sister   ? Asthma Brother   ? ? ?Social History:  ?Social History  ? ?Socioeconomic History  ? Marital status: Single  ?  Spouse name: Not on file  ? Number of children: Not on file  ? Years of education: Not on file  ? Highest education level: Not on file  ?Occupational History  ? Not on file  ?Tobacco Use  ? Smoking status: Never  ? Smokeless tobacco: Never  ?Substance and Sexual Activity  ? Alcohol use: Yes  ?  Alcohol/week: 1.0 standard drink  ?  Types: 1 drink(s) per week  ? Drug use: No  ? Sexual activity: Yes  ?Other Topics Concern  ? Not on file  ?Social History Narrative  ? Not on file  ? ?Social Determinants of Health  ? ?Financial Resource Strain: Not on file  ?Food Insecurity: Not on file  ?Transportation Needs: Not on file  ?Physical Activity: Not on file  ?Stress: Not on file  ?Social Connections: Not on file  ? ? ?Allergies: No Known Allergies ? ?Metabolic Disorder Labs: ?No results found for: HGBA1C, MPG ?No results found for: PROLACTIN ?No results found for: CHOL, TRIG, HDL, CHOLHDL, VLDL, LDLCALC ?Lab Results  ?Component Value Date  ? TSH 1.44 02/02/2017  ? ? ?  Therapeutic Level Labs: ?No results found for: LITHIUM ?No results found for: VALPROATE ?No components found for:  CBMZ ? ?Current Medications: ?Current Outpatient Medications  ?Medication Sig Dispense Refill  ? cariprazine (VRAYLAR) 1.5 MG capsule Take 1 capsule (1.5 mg total) by mouth daily. 30 capsule 2  ? traZODone (DESYREL) 50 MG tablet Take 1 tablet (50 mg total) by mouth at bedtime. 30 tablet 2  ? medroxyPROGESTERone (DEPO-PROVERA) 150 MG/ML injection Inject 150 mg into the muscle every 3 (three) months.    ? ?No current facility-administered medications for this  visit.  ? ? ?Medication Side Effects: none ? ?Orders placed this visit:  No orders of the defined types were placed in this encounter. ? ? ?Psychiatric Specialty Exam: ? ?Review of Systems  ?Musculoskeletal:  Negative for gait problem.  ?Neurological:  Negative for tremors.  ?Psychiatric/Behavioral:    ?     Please refer to HPI   ?There were no vitals taken for this visit.There is no height or weight on file to calculate BMI.  ?General Appearance: Casual and Neat  ?Eye Contact:  Good  ?Speech:  Clear and Coherent and Normal Rate  ?Volume:  Normal  ?Mood:  Depressed  ?Affect:  Appropriate and Congruent  ?Thought Process:  Coherent  ?Orientation:  Full (Time, Place, and Person)  ?Thought Content: Logical   ?Suicidal Thoughts:  No  ?Homicidal Thoughts:  No  ?Memory:  WNL  ?Judgement:  Good  ?Insight:  Good  ?Psychomotor Activity:  Normal  ?Concentration:  Concentration: Good  ?Recall:  Good  ?Fund of Knowledge: Good  ?Language: Good  ?Assets:  Communication Skills ?Desire for Improvement ?Financial Resources/Insurance ?Housing ?Intimacy ?Leisure Time ?Physical Health ?Resilience ?Social Support ?Talents/Skills ?Transportation ?Vocational/Educational  ?ADL's:  Intact  ?Cognition: WNL  ?Prognosis:  Good  ? ?Screenings:  ?PHQ2-9   ? ?Flowsheet Row Office Visit from 06/06/2014 in [facility]  ?PHQ-2 Total Score 6  ?PHQ-9 Total Score 15  ? ?  ? ? ?Receiving Psychotherapy: Yes  ? ?Treatment Plan/Recommendations:  ? ?Plan: ? ?PDMP reviewed ? ?Pristiq 100mg  daily ?D/C Ambien 5mg  daily ?Add Trazadone 50mg  at hs ?Add Vraylar 1.5mg  daily ? ?Request previous psychiatric records and Genesight testing for review. ? ? ?Time spent with patient was 60 minutes. Greater than 50% of face to face time with patient was spent on counseling and coordination of care.   ? ?RTC 4 weeks ? ?Patient advised to contact office with any questions, adverse effects, or acute worsening in signs and symptoms. ?  ?Discussed potential  benefits, risks, and side effects of stimulants with patient to include increased heart rate, palpitations, insomnia, increased anxiety, increased irritability, or decreased appetite.  Instructed patient to contact office if experiencing any significant tolerability issues.  ? ? ? , NP ? ?         ?

## 2021-12-09 ENCOUNTER — Telehealth: Payer: Self-pay | Admitting: Adult Health

## 2021-12-09 NOTE — Telephone Encounter (Signed)
Told patient she had not been on the medication very long and if she can tolerate it the sleepiness to give it a while longer. She is not taking the trazodone.  ?

## 2021-12-09 NOTE — Telephone Encounter (Signed)
Pt called reporting Vraylar started 4/21 is making her very tired and sleepy. Second day started taking at night. Still waking up very sleepy and tired.Can't keep eyes open.Contact # [phone] ?

## 2021-12-16 ENCOUNTER — Telehealth: Payer: Self-pay | Admitting: Adult Health

## 2021-12-16 NOTE — Telephone Encounter (Signed)
Pt called reporting Trazodone is not working for her. She doesn't get sleepy and can't fall a sleep.   [place]  Apt 5/18.  ?

## 2021-12-16 NOTE — Telephone Encounter (Signed)
Called patient. A couple of weeks ago she said she wasn't taking the trazodone because the Leafy Kindle was making her so sleepy. Now she said the sleepiness from the Vraylar has resolved and she is taking the trazodone. She has only taken it 2 nights and I told her she needed to give it more time. She said she had taken Ambien in the past and it was an immediate benefit and she was thinking the same with trazodone. Patient also has a lot of caffeine intake. Told her no caffeine after 3. Told her to call back Friday if she was still not sleeping and we could look at other options at that time.  ?

## 2022-01-02 ENCOUNTER — Telehealth (INDEPENDENT_AMBULATORY_CARE_PROVIDER_SITE_OTHER): Payer: BC Managed Care – PPO | Admitting: Adult Health

## 2022-01-02 ENCOUNTER — Encounter: Payer: Self-pay | Admitting: Adult Health

## 2022-01-02 DIAGNOSIS — G4700 Insomnia, unspecified: Secondary | ICD-10-CM

## 2022-01-02 DIAGNOSIS — F329 Major depressive disorder, single episode, unspecified: Secondary | ICD-10-CM | POA: Diagnosis not present

## 2022-01-02 DIAGNOSIS — F411 Generalized anxiety disorder: Secondary | ICD-10-CM | POA: Diagnosis not present

## 2022-01-02 NOTE — Progress Notes (Signed)
Monica Meyer [birth year] 36 y.o.  Virtual Visit via Video Note  I connected with pt @ on 01/02/22 at  5:00 PM EDT by a video enabled telemedicine application and verified that I am speaking with the correct person using two identifiers.   I discussed the limitations of evaluation and management by telemedicine and the availability of in person appointments. The patient expressed understanding and agreed to proceed.  I discussed the assessment and treatment plan with the patient. The patient was provided an opportunity to ask questions and all were answered. The patient agreed with the plan and demonstrated an understanding of the instructions.   The patient was advised to call back or seek an in-person evaluation if the symptoms worsen or if the condition fails to improve as anticipated.  I provided 25 minutes of non-face-to-face time during this encounter.  The patient was located at home.  The provider was located at [facility].   Dorothyann Gibbs, NP   Subjective:   Patient ID:  Monica Meyer is a 36 y.o. (DOB [birth year]) female.  Chief Complaint: No chief complaint on file.   HPI Monica Meyer presents for follow-up of insomnia, GAD and MDD.  Describes mood today as "ok". Pleasant. Tearful at times. Mood symptoms - reports depression - "not as bad, a little bit". Irritable at times - "at work sometimes". Feels anxious - "not going with the flow or relaxing as much". Mood has been more consistent. Stating "I'm doing a little better". Feels like the Vraylar is helpful, but may be making her to restless. Willing to consider other options. Taking medications as prescribed.  Energy levels lower. Active, does not have a regular exercise routine.   Enjoys some usual interests and activities. Single. Lives alone. Family in [place].Spending time with family. Appetite adequate. Weight gain - a few pounds. Sleeps well most nights. Averages 8 hours. Focus  and concentration difficulties. Completing tasks. Managing aspects of household. Work at [employer]. Denies SI or HI.  Denies AH or VH. Denies self harm THC - daily use ETOH use - social thing.  Previous medication trials:  Zoloft, Lexapro, Viibryd, Rexulti, and others.     Review of Systems:  Review of Systems  Musculoskeletal:  Negative for gait problem.  Neurological:  Negative for tremors.  Psychiatric/Behavioral:         Please refer to HPI   Medications: I have reviewed the patient's current medications.  Current Outpatient Medications  Medication Sig Dispense Refill   cariprazine (VRAYLAR) 1.5 MG capsule Take 1 capsule (1.5 mg total) by mouth daily. 30 capsule 2   medroxyPROGESTERone (DEPO-PROVERA) 150 MG/ML injection Inject 150 mg into the muscle every 3 (three) months.     traZODone (DESYREL) 50 MG tablet Take 1 tablet (50 mg total) by mouth at bedtime. 30 tablet 2   No current facility-administered medications for this visit.    Medication Side Effects: None  Allergies: No Known Allergies  Past Medical History:  Diagnosis Date   Depression     Family History  Problem Relation Age of Onset   Vision loss Mother    Alcohol abuse Father    Vision loss Father    Learning disabilities Sister    Mental retardation Sister    Asthma Brother     Social History   Socioeconomic History   Marital status: Single    Spouse name: Not on file   Number of children: Not on file   Years of education: Not  on file   Highest education level: Not on file  Occupational History   Not on file  Tobacco Use   Smoking status: Never   Smokeless tobacco: Never  Substance and Sexual Activity   Alcohol use: Yes    Alcohol/week: 1.0 standard drink    Types: 1 drink(s) per week   Drug use: No   Sexual activity: Yes  Other Topics Concern   Not on file  Social History Narrative   Not on file   Social Determinants of Health   Financial Resource Strain: Not on file  Food  Insecurity: Not on file  Transportation Needs: Not on file  Physical Activity: Not on file  Stress: Not on file  Social Connections: Not on file  Intimate Partner Violence: Not on file    Past Medical History, Surgical history, Social history, and Family history were reviewed and updated as appropriate.   Please see review of systems for further details on the patient's review from today.   Objective:   Physical Exam:  There were no vitals taken for this visit.  Physical Exam Constitutional:      General: She is not in acute distress. Musculoskeletal:        General: No deformity.  Neurological:     Mental Status: She is alert and oriented to person, place, and time.     Coordination: Coordination normal.  Psychiatric:        Attention and Perception: Attention and perception normal. She does not perceive auditory or visual hallucinations.        Mood and Affect: Mood normal. Mood is not anxious or depressed. Affect is not labile, blunt, angry or inappropriate.        Speech: Speech normal.        Behavior: Behavior normal.        Thought Content: Thought content normal. Thought content is not paranoid or delusional. Thought content does not include homicidal or suicidal ideation. Thought content does not include homicidal or suicidal plan.        Cognition and Memory: Cognition and memory normal.        Judgment: Judgment normal.     Comments: Insight intact    Lab Review:     Component Value Date/Time   NA 139 02/02/2017 1158   K 4.5 02/02/2017 1158   CL 107 02/02/2017 1158   CO2 25 02/02/2017 1158   GLUCOSE 83 02/02/2017 1158   BUN 14 02/02/2017 1158   CREATININE 0.90 02/02/2017 1158   CALCIUM 9.3 02/02/2017 1158   PROT 7.2 02/02/2017 1158   ALBUMIN 4.6 02/02/2017 1158   AST 13 02/02/2017 1158   ALT 10 02/02/2017 1158   ALKPHOS 52 02/02/2017 1158   BILITOT 0.6 02/02/2017 1158   GFRNONAA 86 02/02/2017 1158   GFRAA >89 02/02/2017 1158       Component Value  Date/Time   WBC 5.9 02/02/2017 1158   RBC 4.66 02/02/2017 1158   HGB 13.5 02/02/2017 1158   HCT 40.7 02/02/2017 1158   PLT 310 02/02/2017 1158   MCV 87.3 02/02/2017 1158   MCH 29.0 02/02/2017 1158   MCHC 33.2 02/02/2017 1158   RDW 13.2 02/02/2017 1158   LYMPHSABS 2,183 02/02/2017 1158   MONOABS 295 02/02/2017 1158   EOSABS 177 02/02/2017 1158   BASOSABS 0 02/02/2017 1158    No results found for: POCLITH, LITHIUM   No results found for: PHENYTOIN, PHENOBARB, VALPROATE, CBMZ   .res Assessment: Plan:    Plan:  PDMP reviewed  Pristiq 100mg  daily D/C Ambien 5mg  daily Add Trazadone 50mg  at hs Add Vraylar 1.5mg  daily  Request previous psychiatric records and Genesight testing for review.   Time spent with patient was 60 minutes. Greater than 50% of face to face time with patient was spent on counseling and coordination of care.    RTC 4 weeks  Patient advised to contact office with any questions, adverse effects, or acute worsening in signs and symptoms.   Discussed potential benefits, risks, and side effects of stimulants with patient to include increased heart rate, palpitations, insomnia, increased anxiety, increased irritability, or decreased appetite.  Instructed patient to contact office if experiencing any significant tolerability issues.   Diagnoses and all orders for this visit:  Insomnia, unspecified type  Current episode of major depressive disorder without prior episode, unspecified depression episode severity  GAD (generalized anxiety disorder)     Please see After Visit Summary for patient specific instructions.  No future appointments.   No orders of the defined types were placed in this encounter.     -------------------------------

## 2022-01-07 ENCOUNTER — Other Ambulatory Visit: Payer: Self-pay

## 2022-01-07 MED ORDER — RISPERIDONE 0.5 MG PO TABS: 0.5000 mg | ORAL_TABLET | Freq: Every day | ORAL | 0 refills | Status: DC

## 2022-01-20 ENCOUNTER — Telehealth: Payer: Self-pay | Admitting: Adult Health

## 2022-01-20 NOTE — Telephone Encounter (Signed)
Pt informed samples pulled  °

## 2022-01-20 NOTE — Telephone Encounter (Signed)
Pt stated risperidone is not helping anything at all.She said she is very agitated and angry all the time and nothing has changed since her last visit.Appt on 6/21

## 2022-01-20 NOTE — Telephone Encounter (Signed)
Monica Meyer called this morning at 9:41 to report that the new medication, risperidone, is not working as well as the [employer].  She seems to be more angry. Want do you advise her to do now?  Please call to discuss. Next appt is 6/21.

## 2022-01-20 NOTE — Telephone Encounter (Signed)
We could try the Lybalvi 2.5mg  at hs - samples available.

## 2022-01-20 NOTE — Telephone Encounter (Signed)
LVM to rtc 

## 2022-01-27 ENCOUNTER — Telehealth: Payer: Self-pay | Admitting: Adult Health

## 2022-01-27 ENCOUNTER — Other Ambulatory Visit: Payer: Self-pay

## 2022-01-27 MED ORDER — DESVENLAFAXINE SUCCINATE ER 100 MG PO TB24: 100.0000 mg | ORAL_TABLET | Freq: Every day | ORAL | 0 refills | Status: DC

## 2022-01-27 NOTE — Telephone Encounter (Signed)
Spoke to pt rx sent  

## 2022-01-27 NOTE — Telephone Encounter (Signed)
Please advise and I will send if so

## 2022-01-27 NOTE — Telephone Encounter (Signed)
Ok to send

## 2022-01-27 NOTE — Telephone Encounter (Signed)
Pt is asking if she can get her anti-depressant medicine, Pristiq, refilled by Almira Coaster instead of her PCP.  It needs to be sent to [facility] on [address].   Next appt 6/21

## 2022-02-04 ENCOUNTER — Other Ambulatory Visit: Payer: Self-pay | Admitting: Adult Health

## 2022-02-05 ENCOUNTER — Encounter: Payer: Self-pay | Admitting: Adult Health

## 2022-02-05 ENCOUNTER — Ambulatory Visit (INDEPENDENT_AMBULATORY_CARE_PROVIDER_SITE_OTHER): Payer: BC Managed Care – PPO | Admitting: Adult Health

## 2022-02-05 DIAGNOSIS — F329 Major depressive disorder, single episode, unspecified: Secondary | ICD-10-CM

## 2022-02-05 DIAGNOSIS — G4700 Insomnia, unspecified: Secondary | ICD-10-CM

## 2022-02-05 DIAGNOSIS — F411 Generalized anxiety disorder: Secondary | ICD-10-CM | POA: Diagnosis not present

## 2022-02-05 MED ORDER — ARIPIPRAZOLE 5 MG PO TABS: ORAL_TABLET | ORAL | 2 refills | Status: DC

## 2022-02-05 MED ORDER — DESVENLAFAXINE SUCCINATE ER 100 MG PO TB24: 100.0000 mg | ORAL_TABLET | Freq: Every day | ORAL | 2 refills | Status: DC

## 2022-02-05 MED ORDER — TRAZODONE HCL 50 MG PO TABS: 50.0000 mg | ORAL_TABLET | Freq: Every day | ORAL | 2 refills | Status: DC

## 2022-02-05 NOTE — Progress Notes (Signed)
Monica Meyer [id number] Nov 10, 1985 36 y.o.  Subjective:   Patient ID:  Monica Meyer is a 36 y.o. (DOB 03-30-1986) female.  Chief Complaint: No chief complaint on file.   HPI Monica Meyer presents to the office today for follow-up of insomnia, GAD and MDD.  Describes mood today as "ok". Pleasant. Tearful at times. Mood symptoms - reports depression - "a little bit". Feels anxious - "not too much of that". Irritable at times - "every day stuff". Mood is dry - "just here". Stating "I feel like I have back tracked a little". Currently taking Lybalvi - "not as helpful as it could be". Wanting more energy and happiness. Willing to consider other options. Taking medications as prescribed.  Energy levels lower. Active, does not have a regular exercise routine - plans to start.   Enjoys some usual interests and activities. Single. Lives alone. Family in [place]. Spending time with family. Appetite adequate. Weight gain - 160 pounds. Sleeps well most nights. Averages 8 hours. Focus and concentration difficulties - "a little bit". Completing tasks. Managing aspects of household. Work at [employer]. Denies SI or HI.  Denies AH or VH. Denies self harm THC - daily use ETOH use - social thing.   Previous medication trials:  Zoloft, Lexapro, Viibryd, Rexulti, Risperdal, Vraylar, Lybalvi and others.    PHQ2-9    Flowsheet Row Office Visit from 06/06/2014 in [facility]  PHQ-2 Total Score 6  PHQ-9 Total Score 15        Review of Systems:  Review of Systems  Musculoskeletal:  Negative for gait problem.  Neurological:  Negative for tremors.  Psychiatric/Behavioral:         Please refer to HPI    Medications: I have reviewed the patient's current medications.  Current Outpatient Medications  Medication Sig Dispense Refill   ARIPiprazole (ABILIFY) 5 MG tablet Taking 1/2 tablet daily x 7 days, then increase to one tablet daily. 30 tablet 2   desvenlafaxine  (PRISTIQ) 100 MG 24 hr tablet Take 1 tablet (100 mg total) by mouth daily. 30 tablet 2   medroxyPROGESTERone (DEPO-PROVERA) 150 MG/ML injection Inject 150 mg into the muscle every 3 (three) months.     traZODone (DESYREL) 50 MG tablet Take 1 tablet (50 mg total) by mouth at bedtime. 30 tablet 2   No current facility-administered medications for this visit.    Medication Side Effects: None  Allergies: No Known Allergies  Past Medical History:  Diagnosis Date   Depression     Past Medical History, Surgical history, Social history, and Family history were reviewed and updated as appropriate.   Please see review of systems for further details on the patient's review from today.   Objective:   Physical Exam:  There were no vitals taken for this visit.  Physical Exam Constitutional:      General: She is not in acute distress. Musculoskeletal:        General: No deformity.  Neurological:     Mental Status: She is alert and oriented to person, place, and time.     Coordination: Coordination normal.  Psychiatric:        Attention and Perception: Attention and perception normal. She does not perceive auditory or visual hallucinations.        Mood and Affect: Mood normal. Mood is not anxious or depressed. Affect is not labile, blunt, angry or inappropriate.        Speech: Speech normal.  Behavior: Behavior normal.        Thought Content: Thought content normal. Thought content is not paranoid or delusional. Thought content does not include homicidal or suicidal ideation. Thought content does not include homicidal or suicidal plan.        Cognition and Memory: Cognition and memory normal.        Judgment: Judgment normal.     Comments: Insight intact     Lab Review:     Component Value Date/Time   NA 139 02/02/2017 1158   K 4.5 02/02/2017 1158   CL 107 02/02/2017 1158   CO2 25 02/02/2017 1158   GLUCOSE 83 02/02/2017 1158   BUN 14 02/02/2017 1158   CREATININE 0.90  02/02/2017 1158   CALCIUM 9.3 02/02/2017 1158   PROT 7.2 02/02/2017 1158   ALBUMIN 4.6 02/02/2017 1158   AST 13 02/02/2017 1158   ALT 10 02/02/2017 1158   ALKPHOS 52 02/02/2017 1158   BILITOT 0.6 02/02/2017 1158   GFRNONAA 86 02/02/2017 1158   GFRAA >89 02/02/2017 1158       Component Value Date/Time   WBC 5.9 02/02/2017 1158   RBC 4.66 02/02/2017 1158   HGB 13.5 02/02/2017 1158   HCT 40.7 02/02/2017 1158   PLT 310 02/02/2017 1158   MCV 87.3 02/02/2017 1158   MCH 29.0 02/02/2017 1158   MCHC 33.2 02/02/2017 1158   RDW 13.2 02/02/2017 1158   LYMPHSABS 2,183 02/02/2017 1158   MONOABS 295 02/02/2017 1158   EOSABS 177 02/02/2017 1158   BASOSABS 0 02/02/2017 1158    No results found for: "POCLITH", "LITHIUM"   No results found for: "PHENYTOIN", "PHENOBARB", "VALPROATE", "CBMZ"   .res Assessment: Plan:    Plan:  PDMP reviewed  Pristiq 100mg  daily Trazadone 50mg  at hs D/C Lybalvi 5mg  at hs Add Abilify 5mg  - 1/2 tab daily x 7 days, then one tablet daily  Request previous psychiatric records and Genesight testing for review.  Time spent with patient was 20 minutes. Greater than 50% of face to face time with patient was spent on counseling and coordination of care.    RTC 4 weeks  Patient advised to contact office with any questions, adverse effects, or acute worsening in signs and symptoms.  Discussed potential metabolic side effects associated with atypical antipsychotics, as well as potential risk for movement side effects. Advised pt to contact office if movement side effects occur.    Request Genesight testing - 10/22  Diagnoses and all orders for this visit: Diagnoses and all orders for this visit:  Insomnia, unspecified type -     traZODone (DESYREL) 50 MG tablet; Take 1 tablet (50 mg total) by mouth at bedtime.  Current episode of major depressive disorder without prior episode, unspecified depression episode severity -     desvenlafaxine (PRISTIQ) 100 MG  24 hr tablet; Take 1 tablet (100 mg total) by mouth daily. -     ARIPiprazole (ABILIFY) 5 MG tablet; Taking 1/2 tablet daily x 7 days, then increase to one tablet daily.  GAD (generalized anxiety disorder) -     desvenlafaxine (PRISTIQ) 100 MG 24 hr tablet; Take 1 tablet (100 mg total) by mouth daily.     Please see After Visit Summary for patient specific instructions.  No future appointments.   No orders of the defined types were placed in this encounter.   -------------------------------

## 2022-02-07 ENCOUNTER — Other Ambulatory Visit: Payer: Self-pay

## 2022-02-07 MED ORDER — LURASIDONE HCL 20 MG PO TABS: 20.0000 mg | ORAL_TABLET | Freq: Every day | ORAL | 0 refills | Status: DC

## 2022-02-14 ENCOUNTER — Other Ambulatory Visit: Payer: Self-pay

## 2022-02-14 DIAGNOSIS — G4700 Insomnia, unspecified: Secondary | ICD-10-CM

## 2022-02-14 MED ORDER — TRAZODONE HCL 50 MG PO TABS: 50.0000 mg | ORAL_TABLET | Freq: Every day | ORAL | 0 refills | Status: DC

## 2022-03-05 ENCOUNTER — Telehealth: Payer: Self-pay | Admitting: Adult Health

## 2022-03-05 ENCOUNTER — Other Ambulatory Visit: Payer: Self-pay

## 2022-03-05 MED ORDER — LURASIDONE HCL 20 MG PO TABS: 20.0000 mg | ORAL_TABLET | Freq: Every day | ORAL | 0 refills | Status: DC

## 2022-03-05 NOTE — Telephone Encounter (Signed)
Next visit is 03/10/22. Monica Meyer is requesting a refill for her Latuda 20 mg. She has 5 left. Told her it might too early to get filled but she gave me the refill info and said that [facility] always fills it early.   [id number] [place], [place] - [address] AT [facility] [address] & [facility] [address]  Phone:  [phone]  Fax:  [phone]

## 2022-03-05 NOTE — Telephone Encounter (Signed)
Rx sent 

## 2022-03-10 ENCOUNTER — Encounter: Payer: Self-pay | Admitting: Adult Health

## 2022-03-10 ENCOUNTER — Ambulatory Visit: Payer: BC Managed Care – PPO | Admitting: Adult Health

## 2022-03-10 DIAGNOSIS — G4700 Insomnia, unspecified: Secondary | ICD-10-CM

## 2022-03-10 DIAGNOSIS — F411 Generalized anxiety disorder: Secondary | ICD-10-CM

## 2022-03-10 DIAGNOSIS — F329 Major depressive disorder, single episode, unspecified: Secondary | ICD-10-CM

## 2022-03-10 MED ORDER — LURASIDONE HCL 40 MG PO TABS: 40.0000 mg | ORAL_TABLET | Freq: Every evening | ORAL | 2 refills | Status: DC

## 2022-03-10 NOTE — Progress Notes (Signed)
Monica Meyer [id number] [birth year] 36 y.o.  Subjective:   Patient ID:  Monica Meyer is a 36 y.o. (DOB 01/10/1986) female.  Chief Complaint: No chief complaint on file.   HPI Monica Meyer presents to the office today for follow-up of insomnia, GAD and MDD.  Describes mood today as "ok". Pleasant. Tearful at times. Mood symptoms - reports depression - "a little bit". Feels anxious - "depends on the day". Reports irritability - "every day". Mood is lower. Stating "I feel about the same". Started the Latuda at 20mg  daily - tolerating - is willing to increase dose to 40mg  daily. Varying interest and motivation. Taking medications as prescribed.  Energy levels lower. Active, does not have a regular exercise routine - plans to start.   Enjoys some usual interests and activities. Dating - has a boyfriend. Lives alone. Family in . Spending time with family. Appetite adequate. Weight gain - 160 pounds. Sleeps well most nights. Averages 8 hours. Focus and concentration pretty good. Completing tasks. Managing aspects of household. Work at . Denies SI or HI.  Denies AH or VH. Denies self harm THC - daily use ETOH use - socially  Previous medication trials:  Zoloft, Lexapro, Viibryd, Rexulti, Risperdal, Vraylar, Lybalvi and others.    PHQ2-9    Flowsheet Row Office Visit from 06/06/2014 in [facility]  PHQ-2 Total Score 6  PHQ-9 Total Score 15        Review of Systems:  Review of Systems  Musculoskeletal:  Negative for gait problem.  Neurological:  Negative for tremors.  Psychiatric/Behavioral:         Please refer to HPI    Medications: I have reviewed the patient's current medications.  Current Outpatient Medications  Medication Sig Dispense Refill   lurasidone (LATUDA) 40 MG TABS tablet Take 1 tablet (40 mg total) by mouth at bedtime. 30 tablet 2   desvenlafaxine (PRISTIQ) 100 MG 24 hr tablet Take 1 tablet (100 mg total) by  mouth daily. 30 tablet 2   medroxyPROGESTERone (DEPO-PROVERA) 150 MG/ML injection Inject 150 mg into the muscle every 3 (three) months.     traZODone (DESYREL) 50 MG tablet Take 1 tablet (50 mg total) by mouth at bedtime. 90 tablet 0   No current facility-administered medications for this visit.    Medication Side Effects: None  Allergies: No Known Allergies  Past Medical History:  Diagnosis Date   Depression     Past Medical History, Surgical history, Social history, and Family history were reviewed and updated as appropriate.   Please see review of systems for further details on the patient's review from today.   Objective:   Physical Exam:  There were no vitals taken for this visit.  Physical Exam Constitutional:      General: She is not in acute distress. Musculoskeletal:        General: No deformity.  Neurological:     Mental Status: She is alert and oriented to person, place, and time.     Coordination: Coordination normal.  Psychiatric:        Attention and Perception: Attention and perception normal. She does not perceive auditory or visual hallucinations.        Mood and Affect: Mood normal. Mood is not anxious or depressed. Affect is not labile, blunt, angry or inappropriate.        Speech: Speech normal.        Behavior: Behavior normal.        Thought Content: Thought  content normal. Thought content is not paranoid or delusional. Thought content does not include homicidal or suicidal ideation. Thought content does not include homicidal or suicidal plan.        Cognition and Memory: Cognition and memory normal.        Judgment: Judgment normal.     Comments: Insight intact     Lab Review:     Component Value Date/Time   NA 139 02/02/2017 1158   K 4.5 02/02/2017 1158   CL 107 02/02/2017 1158   CO2 25 02/02/2017 1158   GLUCOSE 83 02/02/2017 1158   BUN 14 02/02/2017 1158   CREATININE 0.90 02/02/2017 1158   CALCIUM 9.3 02/02/2017 1158   PROT 7.2  02/02/2017 1158   ALBUMIN 4.6 02/02/2017 1158   AST 13 02/02/2017 1158   ALT 10 02/02/2017 1158   ALKPHOS 52 02/02/2017 1158   BILITOT 0.6 02/02/2017 1158   GFRNONAA 86 02/02/2017 1158   GFRAA >89 02/02/2017 1158       Component Value Date/Time   WBC 5.9 02/02/2017 1158   RBC 4.66 02/02/2017 1158   HGB 13.5 02/02/2017 1158   HCT 40.7 02/02/2017 1158   PLT 310 02/02/2017 1158   MCV 87.3 02/02/2017 1158   MCH 29.0 02/02/2017 1158   MCHC 33.2 02/02/2017 1158   RDW 13.2 02/02/2017 1158   LYMPHSABS 2,183 02/02/2017 1158   MONOABS 295 02/02/2017 1158   EOSABS 177 02/02/2017 1158   BASOSABS 0 02/02/2017 1158    No results found for: "POCLITH", "LITHIUM"   No results found for: "PHENYTOIN", "PHENOBARB", "VALPROATE", "CBMZ"   .res Assessment: Plan:    Plan:  PDMP reviewed  Pristiq 100mg  daily Trazadone 50mg  at hs Increase Latuda from 20mg  to 40mg  daily.  Gene-sight testing reviewed.  Time spent with patient was 25 minutes. Greater than 50% of face to face time with patient was spent on counseling and coordination of care.    RTC 4 weeks  Patient advised to contact office with any questions, adverse effects, or acute worsening in signs and symptoms.  Discussed potential metabolic side effects associated with atypical antipsychotics, as well as potential risk for movement side effects. Advised pt to contact office if movement side effects occur.     Diagnoses and all orders for this visit:  Current episode of major depressive disorder without prior episode, unspecified depression episode severity -     lurasidone (LATUDA) 40 MG TABS tablet; Take 1 tablet (40 mg total) by mouth at bedtime.  Insomnia, unspecified type  GAD (generalized anxiety disorder)     Please see After Visit Summary for patient specific instructions.  Future Appointments  Date Time Provider Department Center  04/07/2022  5:00 PM Caidin Heidenreich, , NP [facility] None    No orders of the  defined types were placed in this encounter.   -------------------------------

## 2022-03-24 ENCOUNTER — Telehealth: Payer: Self-pay | Admitting: Adult Health

## 2022-03-24 NOTE — Telephone Encounter (Signed)
Monica Meyer called this morning at 10:12 to report that the Jordan is not working.  She wanting to know if there is another bipolar medication she can try, other than vraylar.  Please call to discuss.  Appt 8/21

## 2022-03-24 NOTE — Telephone Encounter (Signed)
Pt stated she does not want to take anything that will interfere with her birth control

## 2022-03-24 NOTE — Telephone Encounter (Signed)
We can try Pristiq 50mg  daily - reviewed [facility] - in the green.

## 2022-03-24 NOTE — Telephone Encounter (Signed)
Would you have her decrease to 20mg  daily for 7 days, then leave off.  Let's try Trileptal - it's on her list in green. Trileptal 150mg  - 1/2 tablet daily for 3 days, then increase to one tablet daily. If she is on birth control - she will need to use added protection.

## 2022-03-24 NOTE — Telephone Encounter (Signed)
You increased latuda 2 weeks ago and she see's no difference in mood.She is still angry/irritable and wants to try another med

## 2022-03-25 ENCOUNTER — Other Ambulatory Visit: Payer: Self-pay | Admitting: Adult Health

## 2022-03-25 DIAGNOSIS — F329 Major depressive disorder, single episode, unspecified: Secondary | ICD-10-CM

## 2022-03-25 MED ORDER — QUETIAPINE FUMARATE ER 50 MG PO TB24: 50.0000 mg | ORAL_TABLET | Freq: Every day | ORAL | 2 refills | Status: DC

## 2022-03-25 NOTE — Telephone Encounter (Signed)
Called and spoke with patient.

## 2022-03-25 NOTE — Telephone Encounter (Signed)
Pt is already taking pristiq 100 mg from another provider

## 2022-04-07 ENCOUNTER — Encounter: Payer: Self-pay | Admitting: Adult Health

## 2022-04-07 ENCOUNTER — Ambulatory Visit (INDEPENDENT_AMBULATORY_CARE_PROVIDER_SITE_OTHER): Payer: BC Managed Care – PPO | Admitting: Adult Health

## 2022-04-07 DIAGNOSIS — F329 Major depressive disorder, single episode, unspecified: Secondary | ICD-10-CM

## 2022-04-07 DIAGNOSIS — F411 Generalized anxiety disorder: Secondary | ICD-10-CM | POA: Diagnosis not present

## 2022-04-07 DIAGNOSIS — G4700 Insomnia, unspecified: Secondary | ICD-10-CM

## 2022-04-07 MED ORDER — DESVENLAFAXINE SUCCINATE ER 100 MG PO TB24: 100.0000 mg | ORAL_TABLET | Freq: Every day | ORAL | 2 refills | Status: DC

## 2022-04-07 MED ORDER — QUETIAPINE FUMARATE ER 150 MG PO TB24: 150.0000 mg | ORAL_TABLET | Freq: Every day | ORAL | 2 refills | Status: DC

## 2022-04-07 NOTE — Progress Notes (Signed)
Monica Meyer [id number] [birth year] 36 y.o.  Subjective:   Patient ID:  Monica Meyer is a 36 y.o. (DOB 1985-10-13) female.  Chief Complaint: No chief complaint on file.   HPI Monica Meyer presents to the office today for follow-up of insomnia, GAD and MDD.  Describes mood today as "ok". Pleasant. Tearful at times. Mood symptoms - reports depression - "a little better". Feels anxious - "not really". Reports decreased irritability - "I haven't been". Mood is more consistent. Stating "I feel like the Seroquel has helped me". Improved interest and motivation. Taking medications as prescribed.  Energy levels lower. Active, does not have a regular exercise routine - plans to start.   Enjoys some usual interests and activities. Dating - has a boyfriend. Lives alone. Family in [place]. Spending time with family. Appetite adequate. Weight gain - 160 pounds. Sleeps well most nights. Averages 8 hours. Focus and concentration varies. Completing tasks. Managing aspects of household. Work at [employer]. Denies SI or HI.  Denies AH or VH. Denies self harm THC - daily use ETOH use - socially  Previous medication trials:  Zoloft, Lexapro, Viibryd, Rexulti, Risperdal, Vraylar, Lybalvi and others.    PHQ2-9    Flowsheet Row Office Visit from 06/06/2014 in [facility]  PHQ-2 Total Score 6  PHQ-9 Total Score 15        Review of Systems:  Review of Systems  Musculoskeletal:  Negative for gait problem.  Neurological:  Negative for tremors.  Psychiatric/Behavioral:         Please refer to HPI    Medications: I have reviewed the patient's current medications.  Current Outpatient Medications  Medication Sig Dispense Refill   desvenlafaxine (PRISTIQ) 100 MG 24 hr tablet Take 1 tablet (100 mg total) by mouth daily. 30 tablet 2   medroxyPROGESTERone (DEPO-PROVERA) 150 MG/ML injection Inject 150 mg into the muscle every 3 (three) months.     QUEtiapine Fumarate  (SEROQUEL XR) 150 MG 24 hr tablet Take 1 tablet (150 mg total) by mouth at bedtime. 30 tablet 2   traZODone (DESYREL) 50 MG tablet Take 1 tablet (50 mg total) by mouth at bedtime. 90 tablet 0   No current facility-administered medications for this visit.    Medication Side Effects: None  Allergies: No Known Allergies  Past Medical History:  Diagnosis Date   Depression     Past Medical History, Surgical history, Social history, and Family history were reviewed and updated as appropriate.   Please see review of systems for further details on the patient's review from today.   Objective:   Physical Exam:  There were no vitals taken for this visit.  Physical Exam Constitutional:      General: She is not in acute distress. Musculoskeletal:        General: No deformity.  Neurological:     Mental Status: She is alert and oriented to person, place, and time.     Coordination: Coordination normal.  Psychiatric:        Attention and Perception: Attention and perception normal. She does not perceive auditory or visual hallucinations.        Mood and Affect: Mood normal. Mood is not anxious or depressed. Affect is not labile, blunt, angry or inappropriate.        Speech: Speech normal.        Behavior: Behavior normal.        Thought Content: Thought content normal. Thought content is not paranoid or delusional. Thought content  does not include homicidal or suicidal ideation. Thought content does not include homicidal or suicidal plan.        Cognition and Memory: Cognition and memory normal.        Judgment: Judgment normal.     Comments: Insight intact     Lab Review:     Component Value Date/Time   NA 139 02/02/2017 1158   K 4.5 02/02/2017 1158   CL 107 02/02/2017 1158   CO2 25 02/02/2017 1158   GLUCOSE 83 02/02/2017 1158   BUN 14 02/02/2017 1158   CREATININE 0.90 02/02/2017 1158   CALCIUM 9.3 02/02/2017 1158   PROT 7.2 02/02/2017 1158   ALBUMIN 4.6 02/02/2017 1158    AST 13 02/02/2017 1158   ALT 10 02/02/2017 1158   ALKPHOS 52 02/02/2017 1158   BILITOT 0.6 02/02/2017 1158   GFRNONAA 86 02/02/2017 1158   GFRAA >89 02/02/2017 1158       Component Value Date/Time   WBC 5.9 02/02/2017 1158   RBC 4.66 02/02/2017 1158   HGB 13.5 02/02/2017 1158   HCT 40.7 02/02/2017 1158   PLT 310 02/02/2017 1158   MCV 87.3 02/02/2017 1158   MCH 29.0 02/02/2017 1158   MCHC 33.2 02/02/2017 1158   RDW 13.2 02/02/2017 1158   LYMPHSABS 2,183 02/02/2017 1158   MONOABS 295 02/02/2017 1158   EOSABS 177 02/02/2017 1158   BASOSABS 0 02/02/2017 1158    No results found for: "POCLITH", "LITHIUM"   No results found for: "PHENYTOIN", "PHENOBARB", "VALPROATE", "CBMZ"   .res Assessment: Plan:    Plan:  PDMP reviewed  Pristiq 100mg  daily Increase Seroquel XR50 to 100mg  x 5 days, then increase to 150mg .  Gene-sight testing reviewed.  Time spent with patient was 25 minutes. Greater than 50% of face to face time with patient was spent on counseling and coordination of care.    RTC 4 weeks  Patient advised to contact office with any questions, adverse effects, or acute worsening in signs and symptoms.  Discussed potential metabolic side effects associated with atypical antipsychotics, as well as potential risk for movement side effects. Advised pt to contact office if movement side effects occur.   Diagnoses and all orders for this visit:  Insomnia, unspecified type  Current episode of major depressive disorder without prior episode, unspecified depression episode severity -     desvenlafaxine (PRISTIQ) 100 MG 24 hr tablet; Take 1 tablet (100 mg total) by mouth daily. -     QUEtiapine Fumarate (SEROQUEL XR) 150 MG 24 hr tablet; Take 1 tablet (150 mg total) by mouth at bedtime.  GAD (generalized anxiety disorder) -     desvenlafaxine (PRISTIQ) 100 MG 24 hr tablet; Take 1 tablet (100 mg total) by mouth daily.     Please see After Visit Summary for patient  specific instructions.  Future Appointments  Date Time Provider Department Center  05/06/2022  5:00 PM Talecia Sherlin, , NP [facility] None    No orders of the defined types were placed in this encounter.   -------------------------------

## 2022-04-09 ENCOUNTER — Telehealth: Payer: Self-pay | Admitting: Adult Health

## 2022-04-09 ENCOUNTER — Other Ambulatory Visit: Payer: Self-pay | Admitting: Adult Health

## 2022-04-09 DIAGNOSIS — F329 Major depressive disorder, single episode, unspecified: Secondary | ICD-10-CM

## 2022-04-09 NOTE — Telephone Encounter (Signed)
Let's back it back down to two of the 50mg  at bedtime and see if that is helpful - the medication seems to be helpful. The sedation will pass.

## 2022-04-09 NOTE — Telephone Encounter (Signed)
Pt takes med around 7 pm.Before the increase she was having problems with still being drowsy the next day.That is still the case since it has been increased.Pharmacy is on back order as well.I asked pt if she wanted to send it somewhere else or if she felt like the med needed to be changed and she wanted me to check with you to see if she should continue taking it.

## 2022-04-09 NOTE — Telephone Encounter (Signed)
Pt informed

## 2022-04-09 NOTE — Telephone Encounter (Signed)
Pt called reporting [facility] out of Quetiapine 150 mg. Pt concerned with increase to 150 mg due to 100 mg causing drowsiness next day at work. Mentioned if you would like to try a different med? Contact # [phone]

## 2022-05-06 ENCOUNTER — Encounter: Payer: Self-pay | Admitting: Physician Assistant

## 2022-05-06 ENCOUNTER — Ambulatory Visit (INDEPENDENT_AMBULATORY_CARE_PROVIDER_SITE_OTHER): Payer: BC Managed Care – PPO | Admitting: Adult Health

## 2022-05-06 ENCOUNTER — Encounter: Payer: Self-pay | Admitting: Adult Health

## 2022-05-06 DIAGNOSIS — G4700 Insomnia, unspecified: Secondary | ICD-10-CM

## 2022-05-06 DIAGNOSIS — F329 Major depressive disorder, single episode, unspecified: Secondary | ICD-10-CM | POA: Diagnosis not present

## 2022-05-06 DIAGNOSIS — F411 Generalized anxiety disorder: Secondary | ICD-10-CM | POA: Diagnosis not present

## 2022-05-06 MED ORDER — DESVENLAFAXINE SUCCINATE ER 100 MG PO TB24: 100.0000 mg | ORAL_TABLET | Freq: Every day | ORAL | 5 refills | Status: DC

## 2022-05-06 MED ORDER — QUETIAPINE FUMARATE ER 50 MG PO TB24: ORAL_TABLET | ORAL | 5 refills | Status: DC

## 2022-05-06 NOTE — Progress Notes (Signed)
Monica Meyer [id number] Jan 24, 1986 36 y.o.  Subjective:   Patient ID:  Monica Meyer is a 36 y.o. (DOB 10-24-1985) female.  Chief Complaint: No chief complaint on file.   HPI Monica Meyer presents to the office today for follow-up of insomnia, GAD and MDD.  Describes mood today as "ok". Pleasant. Tearful at times. Mood symptoms - reports decreased depression, anxiety and irritability. Mood is more consistent. Stating "I feel like I'm doing alright". Feels like the addition of Pristiq and Seroquel XR have been helpful. Improved interest and motivation. Taking medications as prescribed.  Energy levels lower. Active, does not have a regular exercise routine - plans to start.   Enjoys some usual interests and activities. Dating - has a boyfriend. Lives alone. Family in [place]. Spending time with family. Appetite adequate. Weight gain - 160 pounds. Sleeps well most nights. Averages 8 hours. Focus and concentration is variable. Completing tasks. Managing aspects of household. Work at [employer]. Denies SI or HI.  Denies AH or VH. Denies self harm THC - daily use ETOH use - socially  Previous medication trials:  Zoloft, Lexapro, Viibryd, Rexulti, Risperdal, Vraylar, Lybalvi and others.      PHQ2-9    Flowsheet Row Office Visit from 06/06/2014 in [facility]  PHQ-2 Total Score 6  PHQ-9 Total Score 15        Review of Systems:  Review of Systems  Musculoskeletal:  Negative for gait problem.  Neurological:  Negative for tremors.  Psychiatric/Behavioral:         Please refer to HPI    Medications: I have reviewed the patient's current medications.  Current Outpatient Medications  Medication Sig Dispense Refill   desvenlafaxine (PRISTIQ) 100 MG 24 hr tablet Take 1 tablet (100 mg total) by mouth daily. 30 tablet 2   medroxyPROGESTERone (DEPO-PROVERA) 150 MG/ML injection Inject 150 mg into the muscle every 3 (three) months.     QUEtiapine  Fumarate (SEROQUEL XR) 150 MG 24 hr tablet Take 1 tablet (150 mg total) by mouth at bedtime. 30 tablet 2   traZODone (DESYREL) 50 MG tablet Take 1 tablet (50 mg total) by mouth at bedtime. 90 tablet 0   No current facility-administered medications for this visit.    Medication Side Effects: None  Allergies: No Known Allergies  Past Medical History:  Diagnosis Date   Depression     Past Medical History, Surgical history, Social history, and Family history were reviewed and updated as appropriate.   Please see review of systems for further details on the patient's review from today.   Objective:   Physical Exam:  There were no vitals taken for this visit.  Physical Exam Constitutional:      General: She is not in acute distress. Musculoskeletal:        General: No deformity.  Neurological:     Mental Status: She is alert and oriented to person, place, and time.     Coordination: Coordination normal.  Psychiatric:        Attention and Perception: Attention and perception normal. She does not perceive auditory or visual hallucinations.        Mood and Affect: Mood normal. Mood is not anxious or depressed. Affect is not labile, blunt, angry or inappropriate.        Speech: Speech normal.        Behavior: Behavior normal.        Thought Content: Thought content normal. Thought content is not paranoid or delusional. Thought  content does not include homicidal or suicidal ideation. Thought content does not include homicidal or suicidal plan.        Cognition and Memory: Cognition and memory normal.        Judgment: Judgment normal.     Comments: Insight intact     Lab Review:     Component Value Date/Time   NA 139 02/02/2017 1158   K 4.5 02/02/2017 1158   CL 107 02/02/2017 1158   CO2 25 02/02/2017 1158   GLUCOSE 83 02/02/2017 1158   BUN 14 02/02/2017 1158   CREATININE 0.90 02/02/2017 1158   CALCIUM 9.3 02/02/2017 1158   PROT 7.2 02/02/2017 1158   ALBUMIN 4.6 02/02/2017  1158   AST 13 02/02/2017 1158   ALT 10 02/02/2017 1158   ALKPHOS 52 02/02/2017 1158   BILITOT 0.6 02/02/2017 1158   GFRNONAA 86 02/02/2017 1158   GFRAA >89 02/02/2017 1158       Component Value Date/Time   WBC 5.9 02/02/2017 1158   RBC 4.66 02/02/2017 1158   HGB 13.5 02/02/2017 1158   HCT 40.7 02/02/2017 1158   PLT 310 02/02/2017 1158   MCV 87.3 02/02/2017 1158   MCH 29.0 02/02/2017 1158   MCHC 33.2 02/02/2017 1158   RDW 13.2 02/02/2017 1158   LYMPHSABS 2,183 02/02/2017 1158   MONOABS 295 02/02/2017 1158   EOSABS 177 02/02/2017 1158   BASOSABS 0 02/02/2017 1158    No results found for: "POCLITH", "LITHIUM"   No results found for: "PHENYTOIN", "PHENOBARB", "VALPROATE", "CBMZ"   .res Assessment: Plan:    Plan:  PDMP reviewed  Pristiq 100mg  daily Seroquel XR 100mg  at hs  Gene-sight testing reviewed.  Time spent with patient was 25 minutes. Greater than 50% of face to face time with patient was spent on counseling and coordination of care.    RTC 2 months  Patient advised to contact office with any questions, adverse effects, or acute worsening in signs and symptoms.  Discussed potential metabolic side effects associated with atypical antipsychotics, as well as potential risk for movement side effects. Advised pt to contact office if movement side effects occur.   There are no diagnoses linked to this encounter.   Please see After Visit Summary for patient specific instructions.  Future Appointments  Date Time Provider [place]  06/09/2022  8:30 AM Levin Erp, PA [facility]    No orders of the defined types were placed in this encounter.   -------------------------------

## 2022-05-08 ENCOUNTER — Other Ambulatory Visit: Payer: Self-pay

## 2022-05-08 DIAGNOSIS — F329 Major depressive disorder, single episode, unspecified: Secondary | ICD-10-CM

## 2022-05-08 DIAGNOSIS — F411 Generalized anxiety disorder: Secondary | ICD-10-CM

## 2022-05-08 MED ORDER — DESVENLAFAXINE SUCCINATE ER 100 MG PO TB24: 100.0000 mg | ORAL_TABLET | Freq: Every day | ORAL | 0 refills | Status: DC

## 2022-05-19 ENCOUNTER — Other Ambulatory Visit: Payer: Self-pay | Admitting: Adult Health

## 2022-05-19 DIAGNOSIS — G4700 Insomnia, unspecified: Secondary | ICD-10-CM

## 2022-06-04 ENCOUNTER — Ambulatory Visit (INDEPENDENT_AMBULATORY_CARE_PROVIDER_SITE_OTHER): Payer: BC Managed Care – PPO | Admitting: Adult Health

## 2022-06-04 ENCOUNTER — Encounter: Payer: Self-pay | Admitting: Adult Health

## 2022-06-04 DIAGNOSIS — F411 Generalized anxiety disorder: Secondary | ICD-10-CM

## 2022-06-04 DIAGNOSIS — F329 Major depressive disorder, single episode, unspecified: Secondary | ICD-10-CM

## 2022-06-04 DIAGNOSIS — G4700 Insomnia, unspecified: Secondary | ICD-10-CM | POA: Diagnosis not present

## 2022-06-04 MED ORDER — QUETIAPINE FUMARATE ER 150 MG PO TB24: ORAL_TABLET | ORAL | 2 refills | Status: DC

## 2022-06-04 NOTE — Progress Notes (Signed)
Lacia Dobson [id number] 1986/02/20 36 y.o.  Subjective:   Patient ID:  Monica Meyer is a 36 y.o. (DOB 1986/06/04) female.  Chief Complaint: No chief complaint on file.   HPI Monica Meyer presents to the office today for follow-up of insomnia, GAD and MDD.  Describes mood today as "ok". Pleasant. Tearful at times. Mood symptoms - reports increased depression - boyfriend broke up with her 2 weeks ago - father has been in and out of the hospital. Decreased anxiety and irritability. Reports increased worry - over thinking. Mood is lower. Stating "I feel like I'm doing alright". Feels like the addition of Pristiq and Seroquel XR have been helpful. Improved interest and motivation. Taking medications as prescribed.  Energy levels lower. Active, does not have a regular exercise routine.   Enjoys some usual interests and activities. Single. Lives alone. Family in [place]. Spending time with family. Appetite decreased. Weight loss - 10 pounds - 150 pounds. Sleeps well most nights. Averages 8 hours. Focus and concentration is variable. Completing tasks. Managing aspects of household. Work at [employer]. Denies SI or HI.  Denies AH or VH. Denies self harm THC - daily use ETOH use - socially  Ivonne Andrew therapist - [facility]  Previous medication trials:  Zoloft, Lexapro, Viibryd, [place], Risperdal, [place], Lybalvi and others.      [facility] Office Visit from 06/06/2014 in [facility]  PHQ-2 Total Score 6  PHQ-9 Total Score 15        Review of Systems:  Review of Systems  Musculoskeletal:  Negative for gait problem.  Neurological:  Negative for tremors.  Psychiatric/Behavioral:         Please refer to HPI    Medications: I have reviewed the patient's current medications.  Current Outpatient Medications  Medication Sig Dispense Refill   desvenlafaxine (PRISTIQ) 100 MG 24 hr tablet Take 1 tablet (100 mg total) by mouth  daily. 90 tablet 0   medroxyPROGESTERone (DEPO-PROVERA) 150 MG/ML injection Inject 150 mg into the muscle every 3 (three) months.     QUEtiapine (SEROQUEL XR) 50 MG TB24 24 hr tablet Take two tablets at bedtime. 60 tablet 5   No current facility-administered medications for this visit.    Medication Side Effects: None  Allergies: No Known Allergies  Past Medical History:  Diagnosis Date   Depression     Past Medical History, Surgical history, Social history, and Family history were reviewed and updated as appropriate.   Please see review of systems for further details on the patient's review from today.   Objective:   Physical Exam:  There were no vitals taken for this visit.  Physical Exam Constitutional:      General: She is not in acute distress. Musculoskeletal:        General: No deformity.  Neurological:     Mental Status: She is alert and oriented to person, place, and time.     Coordination: Coordination normal.  Psychiatric:        Attention and Perception: Attention and perception normal. She does not perceive auditory or visual hallucinations.        Mood and Affect: Mood normal. Mood is not anxious or depressed. Affect is not labile, blunt, angry or inappropriate.        Speech: Speech normal.        Behavior: Behavior normal.        Thought Content: Thought content normal. Thought content is not paranoid or  delusional. Thought content does not include homicidal or suicidal ideation. Thought content does not include homicidal or suicidal plan.        Cognition and Memory: Cognition and memory normal.        Judgment: Judgment normal.     Comments: Insight intact     Lab Review:     Component Value Date/Time   NA 139 02/02/2017 1158   K 4.5 02/02/2017 1158   CL 107 02/02/2017 1158   CO2 25 02/02/2017 1158   GLUCOSE 83 02/02/2017 1158   BUN 14 02/02/2017 1158   CREATININE 0.90 02/02/2017 1158   CALCIUM 9.3 02/02/2017 1158   PROT 7.2 02/02/2017 1158    ALBUMIN 4.6 02/02/2017 1158   AST 13 02/02/2017 1158   ALT 10 02/02/2017 1158   ALKPHOS 52 02/02/2017 1158   BILITOT 0.6 02/02/2017 1158   GFRNONAA 86 02/02/2017 1158   GFRAA >89 02/02/2017 1158       Component Value Date/Time   WBC 5.9 02/02/2017 1158   RBC 4.66 02/02/2017 1158   HGB 13.5 02/02/2017 1158   HCT 40.7 02/02/2017 1158   PLT 310 02/02/2017 1158   MCV 87.3 02/02/2017 1158   MCH 29.0 02/02/2017 1158   MCHC 33.2 02/02/2017 1158   RDW 13.2 02/02/2017 1158   LYMPHSABS 2,183 02/02/2017 1158   MONOABS 295 02/02/2017 1158   EOSABS 177 02/02/2017 1158   BASOSABS 0 02/02/2017 1158    No results found for: "POCLITH", "LITHIUM"   No results found for: "PHENYTOIN", "PHENOBARB", "VALPROATE", "CBMZ"   .res Assessment: Plan:    Plan:  PDMP reviewed  Pristiq 100mg  to 150mg  daily Increase Seroquel XR 100mg  to 150mg  at hs  Gene-sight testing reviewed.  Time spent with patient was 25 minutes. Greater than 50% of face to face time with patient was spent on counseling and coordination of care - medication adjusted.    RTC 4 weeks  Patient advised to contact office with any questions, adverse effects, or acute worsening in signs and symptoms.  Discussed potential metabolic side effects associated with atypical antipsychotics, as well as potential risk for movement side effects. Advised pt to contact office if movement side effects occur.    There are no diagnoses linked to this encounter.   Please see After Visit Summary for patient specific instructions.  Future Appointments  Date Time Provider [place]  06/04/2022  5:00 PM Di Jasmer, Berdie Ogren, NP [facility] None  06/09/2022  8:30 AM Fabio Asa, Lavone Nian, PA [facility]    No orders of the defined types were placed in this encounter.   -------------------------------

## 2022-06-09 ENCOUNTER — Encounter: Payer: Self-pay | Admitting: Physician Assistant

## 2022-06-09 ENCOUNTER — Other Ambulatory Visit (INDEPENDENT_AMBULATORY_CARE_PROVIDER_SITE_OTHER): Payer: BC Managed Care – PPO

## 2022-06-09 ENCOUNTER — Ambulatory Visit (INDEPENDENT_AMBULATORY_CARE_PROVIDER_SITE_OTHER): Payer: BC Managed Care – PPO | Admitting: Physician Assistant

## 2022-06-09 VITALS — BP 110/78 | HR 93 | Ht 64.0 in | Wt 155.2 lb

## 2022-06-09 DIAGNOSIS — R1084 Generalized abdominal pain: Secondary | ICD-10-CM

## 2022-06-09 DIAGNOSIS — R194 Change in bowel habit: Secondary | ICD-10-CM

## 2022-06-09 DIAGNOSIS — R140 Abdominal distension (gaseous): Secondary | ICD-10-CM

## 2022-06-09 DIAGNOSIS — R110 Nausea: Secondary | ICD-10-CM | POA: Diagnosis not present

## 2022-06-09 LAB — COMPREHENSIVE METABOLIC PANEL
ALT: 20 U/L (ref 0–35)
AST: 15 U/L (ref 0–37)
Albumin: 4.2 g/dL (ref 3.5–5.2)
Alkaline Phosphatase: 69 U/L (ref 39–117)
BUN: 8 mg/dL (ref 6–23)
CO2: 26 mEq/L (ref 19–32)
Calcium: 9 mg/dL (ref 8.4–10.5)
Chloride: 106 mEq/L (ref 96–112)
Creatinine, Ser: 0.98 mg/dL (ref 0.40–1.20)
GFR: 74.74 mL/min (ref >60.00)
Glucose, Bld: 91 mg/dL (ref 70–99)
Potassium: 3.9 mEq/L (ref 3.5–5.1)
Sodium: 139 mEq/L (ref 135–145)
Total Bilirubin: 0.5 mg/dL (ref 0.2–1.2)
Total Protein: 7.3 g/dL (ref 6.0–8.3)

## 2022-06-09 LAB — CBC WITH DIFFERENTIAL/PLATELET
Basophils Absolute: 0 10*3/uL (ref 0.0–0.1)
Basophils Relative: 0.8 % (ref 0.0–3.0)
Eosinophils Absolute: 0.4 10*3/uL (ref 0.0–0.7)
Eosinophils Relative: 5.9 % — ABNORMAL HIGH (ref 0.0–5.0)
HCT: 38.7 % (ref 36.0–46.0)
Hemoglobin: 12.8 g/dL (ref 12.0–15.0)
Lymphocytes Relative: 37 % (ref 12.0–46.0)
Lymphs Abs: 2.4 10*3/uL (ref 0.7–4.0)
MCHC: 33.2 g/dL (ref 30.0–36.0)
MCV: 85.8 fl (ref 78.0–100.0)
Monocytes Absolute: 0.3 10*3/uL (ref 0.1–1.0)
Monocytes Relative: 4.6 % (ref 3.0–12.0)
Neutro Abs: 3.3 10*3/uL (ref 1.4–7.7)
Neutrophils Relative %: 51.7 % (ref 43.0–77.0)
Platelets: 356 10*3/uL (ref 150.0–400.0)
RBC: 4.52 Mil/uL (ref 3.87–5.11)
RDW: 13.1 % (ref 11.5–15.5)
WBC: 6.4 10*3/uL (ref 4.0–10.5)

## 2022-06-09 LAB — TSH: TSH: 2.48 u[IU]/mL (ref 0.35–5.50)

## 2022-06-09 MED ORDER — NA SULFATE-K SULFATE-MG SULF 17.5-3.13-1.6 GM/177ML PO SOLN: 1.0000 | Freq: Once | ORAL | 0 refills | Status: AC

## 2022-06-09 NOTE — Patient Instructions (Addendum)
Your provider has requested that you go to the basement level for lab work before leaving today. Press "B" on the elevator. The lab is located at the first door on the left as you exit the elevator.    _______________________________________________________  If you are age 35 or older, your body mass index should be between 23-30. Your Body mass index is 26.64 kg/m. If this is out of the aforementioned range listed, please consider follow up with your Primary Care Provider.  If you are age 69 or younger, your body mass index should be between 19-25. Your Body mass index is 26.64 kg/m. If this is out of the aformentioned range listed, please consider follow up with your Primary Care Provider.   ________________________________________________________  The [facility] providers would like to encourage you to use [facility] to communicate with providers for non-urgent requests or questions.  Due to long hold times on the telephone, sending your provider a message by [facility] may be a faster and more efficient way to get a response.  Please allow 48 business hours for a response.  Please remember that this is for non-urgent requests.  _______________________________________________________

## 2022-06-09 NOTE — Progress Notes (Signed)
Chief Complaint: Abdominal pain, Bloating and Weight Gain  HPI:  Monica Meyer is a  36 y/o AA female who was referred to me by Dr. Parke Simmers, for complaint of abdominal pain, bloating and weight gain.    06/04/2022 patient followed with behavioral health for major depressive disorder.  That time discussed appetite decreased and a weight loss of 10 pounds.  She was on Pristiq and Seroquel.    Today, the patient tells me that she has had belly problems/GI problems her entire life.  When she was young she would eat and shortly thereafter get a very "sharp pain", so she just avoided eating, eventually got down to around 100 or so pounds and needed to "put on weight", so she started working out and tried a couple different GI doctors it sounds like who diagnosed her with IBS and tried meds which "did not help".  She then went holistic and started Flaxseed oil, Turmeric and Peppermint oil and this helped a little bit with the "sharp pain", but she still had ongoing issues. No endoscopic workup.    Most recently the patient tells me that she has been gaining weight without really trying and feels like it is all around her stomach area.  She feels bloated and uncomfortable a lot of the time and after eating just about anything she feels "blah", a little bit nauseous but no heartburn or reflux.  Also tells me she has very inconsistent bowel habits, sometimes it is loose sometimes its not and sometimes she does not have one at all.    Denies family history of GI issues or GI cancers.    Denies fever, chills, weight loss, blood in her stool, vomiting or symptoms that awaken her from sleep.  Past Medical History:  Diagnosis Date   Depression    IBS (irritable bowel syndrome)     Past Surgical History:  Procedure Laterality Date   COLPOSCOPY      Current Outpatient Medications  Medication Sig Dispense Refill   desvenlafaxine (PRISTIQ) 100 MG 24 hr tablet Take 1 tablet (100 mg total) by mouth daily. 90  tablet 0   medroxyPROGESTERone (DEPO-PROVERA) 150 MG/ML injection Inject 150 mg into the muscle every 3 (three) months.     QUEtiapine Fumarate (SEROQUEL XR) 150 MG 24 hr tablet Take one tablet at bedtime. 30 tablet 2   chlorpheniramine (CHLOR-TRIMETON) 4 MG tablet Allergy Relief (Chlorpheniramine)     No current facility-administered medications for this visit.    Allergies as of 06/09/2022   (No Known Allergies)    Family History  Problem Relation Age of Onset   Vision loss Mother    Alcohol abuse Father    Vision loss Father    Learning disabilities Sister    Mental retardation Sister    Asthma Brother    Lupus Paternal Grandmother    Colon cancer Neg Hx    Esophageal cancer Neg Hx    Liver disease Neg Hx     Social History   Socioeconomic History   Marital status: Single    Spouse name: Not on file   Number of children: Not on file   Years of education: Not on file   Highest education level: Not on file  Occupational History   Occupation: Environmental consultant  Tobacco Use   Smoking status: Never   Smokeless tobacco: Never  Substance and Sexual Activity   Alcohol use: Yes    Alcohol/week: 1.0 standard drink of alcohol    Types: 1 drink(s)  per week    Comment: soically   Drug use: Yes    Types: Marijuana   Sexual activity: Yes  Other Topics Concern   Not on file  Social History Narrative   Not on file   Social Determinants of Health   Financial Resource Strain: Not on file  Food Insecurity: Not on file  Transportation Needs: Not on file  Physical Activity: Not on file  Stress: Not on file  Social Connections: Not on file  Intimate Partner Violence: Not on file    Review of Systems:    Constitutional: No weight loss, fever or chills Skin: No rash Cardiovascular: No chest pain Respiratory: No SOB  Gastrointestinal: See HPI and otherwise negative Genitourinary: No dysuria  Neurological: No headache, dizziness or syncope Musculoskeletal: No new muscle or  joint pain Hematologic: No bleeding  Psychiatric: No history of depression or anxiety   Physical Exam:  Vital signs: BP 110/78   Pulse 93   Ht 5\' 4"  (1.626 m)   Wt 155 lb 3.2 oz (70.4 kg)   SpO2 94%   BMI 26.64 kg/m    Constitutional:   Pleasant AA female appears to be in NAD, Well developed, Well nourished, alert and cooperative Head:  Normocephalic and atraumatic. Eyes:   PEERL, EOMI. No icterus. Conjunctiva pink. Ears:  Normal auditory acuity. Neck:  Supple Throat: Oral cavity and pharynx without inflammation, swelling or lesion.  Respiratory: Respirations even and unlabored. Lungs clear to auscultation bilaterally.   No wheezes, crackles, or rhonchi.  Cardiovascular: Normal S1, S2. No MRG. Regular rate and rhythm. No peripheral edema, cyanosis or pallor.  Gastrointestinal:  Soft, nondistended, nontender. No rebound or guarding. Normal bowel sounds. No appreciable masses or hepatomegaly. Rectal:  Not performed.  Msk:  Symmetrical without gross deformities. Without edema, no deformity or joint abnormality.  Neurologic:  Alert and  oriented x4;  grossly normal neurologically.  Skin:   Dry and intact without significant lesions or rashes. Psychiatric: Demonstrates good judgement and reason without abnormal affect or behaviors.  No recent labs or imaging.  Assessment: 1.  Generalized abdominal pain: Has abdominal pain typically after eating but can be felt in her lower abdomen as well, variance in bowel habits between constipation and diarrhea as well as bloating and weight gain with some nausea, previous diagnosis of IBS about 10 years ago but no endoscopic work-up, history of major depressive disorder; most likely IBS but could consider H. pylori +/- SIBO +/- IBD 2.  Weight gain 3.  Nausea 4.  Change in bowel habits  Plan: 1.  Ordered CBC, CMP, IgA and TTG as well as TSH 2.  Recommended diagnostic EGD and colonoscopy in the [facility] with Dr. Lyndel Safe.  Did provide the patient a  detailed list of risks for the procedures and she agrees to proceed. Patient is appropriate for endoscopic procedure(s) in the ambulatory ([facility]) setting.  She declined to schedule procedures today after I left the room. 3.  Discussed with patient that she can continue her Peppermint oil if she finds this helpful.  Her symptoms certainly sound like irritable bowel, but with how long they have been going on and the variance in symptoms recommend further evaluation to rule out other etiologies. 4.  Discussed weight gain around her stomach area, this is likely not related to GI issues unless she is bloated/constipated and even then the actual weight itself has nothing to do with this. 5.  Patient to follow in clinic per recommendations after labs and  testing above.  Could consider SIBO testing in the future.  Hyacinth Meeker, PA-C [facility] 06/09/2022, 8:48 AM

## 2022-06-10 LAB — TISSUE TRANSGLUTAMINASE ABS,IGG,IGA
(tTG) Ab, IgA: <1 U/mL
(tTG) Ab, IgG: <1 U/mL

## 2022-06-10 LAB — IGA: Immunoglobulin A: 299 mg/dL (ref 47–310)

## 2022-06-22 NOTE — Progress Notes (Signed)
Agree with assessment/plan.  Raj Vicki Pasqual, MD [facility] [phone]  

## 2022-07-02 ENCOUNTER — Encounter: Payer: Self-pay | Admitting: Adult Health

## 2022-07-02 ENCOUNTER — Ambulatory Visit: Payer: BC Managed Care – PPO | Admitting: Adult Health

## 2022-07-02 DIAGNOSIS — F329 Major depressive disorder, single episode, unspecified: Secondary | ICD-10-CM

## 2022-07-02 DIAGNOSIS — G4700 Insomnia, unspecified: Secondary | ICD-10-CM

## 2022-07-02 DIAGNOSIS — F411 Generalized anxiety disorder: Secondary | ICD-10-CM

## 2022-07-02 MED ORDER — QUETIAPINE FUMARATE ER 150 MG PO TB24: ORAL_TABLET | ORAL | 1 refills | Status: DC

## 2022-07-02 MED ORDER — DESVENLAFAXINE SUCCINATE ER 100 MG PO TB24: 100.0000 mg | ORAL_TABLET | Freq: Every day | ORAL | 1 refills | Status: DC

## 2022-07-02 MED ORDER — BUPROPION HCL ER (XL) 150 MG PO TB24: 150.0000 mg | ORAL_TABLET | Freq: Every day | ORAL | 2 refills | Status: DC

## 2022-07-02 NOTE — Progress Notes (Signed)
Monica Meyer [id number] 24-May-1986 36 y.o.  Subjective:   Patient ID:  Monica Meyer is a 36 y.o. (DOB Dec 17, 1985) female.  Chief Complaint: No chief complaint on file.   HPI Monica Meyer presents to the office today for follow-up of insomnia, GAD and MDD.  Describes mood today as "ok". Pleasant. Decreased tearfulness. Mood symptoms - reports increased depression, anxiety and irritability. Reports worry - over thinking. Mood is lower. Stating "I haven't been doing too good". Feels like the Pristiq and Seroquel XR have been helpful, but is till experiencing mood instability - willing to consider other options. Decreased interest and motivation. Taking medications as prescribed.  Energy levels lower. Active, does not have a regular exercise routine.   Enjoys some usual interests and activities. Single. Lives alone. Family in [place]. Spending time with family. Appetite decreased. Weight loss - 150+ pounds. Sleeps well most nights. Averages 8 hours. Focus and concentration is lower. Completing tasks. Managing aspects of household. Work at [employer]. Denies SI or HI.  Denies AH or VH. Denies self harm THC - daily use ETOH use - socially  Valentina Lucks therapist - [facility]  Previous medication trials:  Zoloft, Lexapro, Viibryd, [place], Risperdal, [place], Lybalvi and others.     PHQ2-9    Flowsheet Row Office Visit from 06/06/2014 in [facility]  PHQ-2 Total Score 6  PHQ-9 Total Score 15        Review of Systems:  Review of Systems  Musculoskeletal:  Negative for gait problem.  Neurological:  Negative for tremors.  Psychiatric/Behavioral:         Please refer to HPI    Medications: I have reviewed the patient's current medications.  Current Outpatient Medications  Medication Sig Dispense Refill   buPROPion (WELLBUTRIN XL) 150 MG 24 hr tablet Take 1 tablet (150 mg total) by mouth daily. 30 tablet 2   chlorpheniramine  (CHLOR-TRIMETON) 4 MG tablet Allergy Relief (Chlorpheniramine)     desvenlafaxine (PRISTIQ) 100 MG 24 hr tablet Take 1 tablet (100 mg total) by mouth daily. 90 tablet 1   medroxyPROGESTERone (DEPO-PROVERA) 150 MG/ML injection Inject 150 mg into the muscle every 3 (three) months.     QUEtiapine Fumarate (SEROQUEL XR) 150 MG 24 hr tablet Take one tablet at bedtime. 90 tablet 1   No current facility-administered medications for this visit.    Medication Side Effects: None  Allergies: No Known Allergies  Past Medical History:  Diagnosis Date   Depression    IBS (irritable bowel syndrome)     Past Medical History, Surgical history, Social history, and Family history were reviewed and updated as appropriate.   Please see review of systems for further details on the patient's review from today.   Objective:   Physical Exam:  There were no vitals taken for this visit.  Physical Exam Constitutional:      General: She is not in acute distress. Musculoskeletal:        General: No deformity.  Neurological:     Mental Status: She is alert and oriented to person, place, and time.     Coordination: Coordination normal.  Psychiatric:        Attention and Perception: Attention and perception normal. She does not perceive auditory or visual hallucinations.        Mood and Affect: Mood normal. Mood is not anxious or depressed. Affect is not labile, blunt, angry or inappropriate.        Speech: Speech normal.  Behavior: Behavior normal.        Thought Content: Thought content normal. Thought content is not paranoid or delusional. Thought content does not include homicidal or suicidal ideation. Thought content does not include homicidal or suicidal plan.        Cognition and Memory: Cognition and memory normal.        Judgment: Judgment normal.     Comments: Insight intact     Lab Review:     Component Value Date/Time   NA 139 06/09/2022 0929   K 3.9 06/09/2022 0929   CL 106  06/09/2022 0929   CO2 26 06/09/2022 0929   GLUCOSE 91 06/09/2022 0929   BUN 8 06/09/2022 0929   CREATININE 0.98 06/09/2022 0929   CREATININE 0.90 02/02/2017 1158   CALCIUM 9.0 06/09/2022 0929   PROT 7.3 06/09/2022 0929   ALBUMIN 4.2 06/09/2022 0929   AST 15 06/09/2022 0929   ALT 20 06/09/2022 0929   ALKPHOS 69 06/09/2022 0929   BILITOT 0.5 06/09/2022 0929   GFRNONAA 86 02/02/2017 1158   GFRAA >89 02/02/2017 1158       Component Value Date/Time   WBC 6.4 06/09/2022 0929   RBC 4.52 06/09/2022 0929   HGB 12.8 06/09/2022 0929   HCT 38.7 06/09/2022 0929   PLT 356.0 06/09/2022 0929   MCV 85.8 06/09/2022 0929   MCH 29.0 02/02/2017 1158   MCHC 33.2 06/09/2022 0929   RDW 13.1 06/09/2022 0929   LYMPHSABS 2.4 06/09/2022 0929   MONOABS 0.3 06/09/2022 0929   EOSABS 0.4 06/09/2022 0929   BASOSABS 0.0 06/09/2022 0929    No results found for: "POCLITH", "LITHIUM"   No results found for: "PHENYTOIN", "PHENOBARB", "VALPROATE", "CBMZ"   .res Assessment: Plan:    Plan:  PDMP reviewed  Add Wellbutrin XL 150mg  every morning - genesight reviewed.  Pristiq 100mg  daily Increase Seroquel XR 150mg  at hs   Time spent with patient was 25 minutes. Greater than 50% of face to face time with patient was spent on counseling and coordination of care - medication adjusted.    RTC 4 weeks  Patient advised to contact office with any questions, adverse effects, or acute worsening in signs and symptoms.  Discussed potential metabolic side effects associated with atypical antipsychotics, as well as potential risk for movement side effects. Advised pt to contact office if movement side effects occur.   Diagnoses and all orders for this visit:  Current episode of major depressive disorder without prior episode, unspecified depression episode severity -     buPROPion (WELLBUTRIN XL) 150 MG 24 hr tablet; Take 1 tablet (150 mg total) by mouth daily. -     QUEtiapine Fumarate (SEROQUEL XR) 150 MG 24  hr tablet; Take one tablet at bedtime. -     desvenlafaxine (PRISTIQ) 100 MG 24 hr tablet; Take 1 tablet (100 mg total) by mouth daily.  GAD (generalized anxiety disorder) -     desvenlafaxine (PRISTIQ) 100 MG 24 hr tablet; Take 1 tablet (100 mg total) by mouth daily.  Insomnia, unspecified type     Please see After Visit Summary for patient specific instructions.  Future Appointments  Date Time Provider Department Center  07/31/2022  5:00 PM Neita Landrigan, , NP [facility]-CP None    No orders of the defined types were placed in this encounter.   -------------------------------

## 2022-07-28 ENCOUNTER — Encounter: Payer: BC Managed Care – PPO | Admitting: Gastroenterology

## 2022-07-31 ENCOUNTER — Encounter: Payer: Self-pay | Admitting: Adult Health

## 2022-07-31 ENCOUNTER — Telehealth (INDEPENDENT_AMBULATORY_CARE_PROVIDER_SITE_OTHER): Payer: BC Managed Care – PPO | Admitting: Adult Health

## 2022-07-31 DIAGNOSIS — G4700 Insomnia, unspecified: Secondary | ICD-10-CM

## 2022-07-31 DIAGNOSIS — F411 Generalized anxiety disorder: Secondary | ICD-10-CM

## 2022-07-31 DIAGNOSIS — F329 Major depressive disorder, single episode, unspecified: Secondary | ICD-10-CM

## 2022-07-31 MED ORDER — BUPROPION HCL ER (XL) 150 MG PO TB24: 150.0000 mg | ORAL_TABLET | Freq: Every day | ORAL | 2 refills | Status: DC

## 2022-07-31 NOTE — Progress Notes (Signed)
Monica Meyer [id number] 12/10/1985 36 y.o.  Virtual Visit via Video Note  I connected with pt @ on 07/31/22 at  5:00 PM EST by a video enabled telemedicine application and verified that I am speaking with the correct person using two identifiers.   I discussed the limitations of evaluation and management by telemedicine and the availability of in person appointments. The patient expressed understanding and agreed to proceed.  I discussed the assessment and treatment plan with the patient. The patient was provided an opportunity to ask questions and all were answered. The patient agreed with the plan and demonstrated an understanding of the instructions.   The patient was advised to call back or seek an in-person evaluation if the symptoms worsen or if the condition fails to improve as anticipated.  I provided 20 minutes of non-face-to-face time during this encounter.  The patient was located at home.  The provider was located at [facility].   Dorothyann Gibbs, NP   Subjective:   Patient ID:  Monica Meyer is a 36 y.o. (DOB Dec 21, 1985) female.  Chief Complaint: No chief complaint on file.   HPI Monica Meyer presents for follow-up of insomnia, GAD and MDD.  Describes mood today as "ok". Pleasant. Decreased tearfulness. Mood symptoms - reports decreased depression, anxiety and irritability. Reports worry - over thinking - "just a little bit". Mood is variable. Stating "I'm doing alright for now". Feels like the addition of Wellbutrin has been helpful. Would like to give the medication a month or two longer to see how they work for her. Varying interest and motivation. Taking medications as prescribed.  Energy levels variable - feels more alert. Active, does not have a regular exercise routine.   Enjoys some usual interests and activities. Single. Lives alone. Family in [place]. Spending time with family. Appetite decreased. Weight loss - 150+ pounds. Sleeps  well most nights. Averages 8 hours - reports some difficulties getting to sleep a few nights.. Focus and concentration is lower. Completing tasks. Managing aspects of household. Work at [employer]. Denies SI or HI.  Denies AH or VH. Denies self harm THC - daily use ETOH use - socially  Valentina Lucks therapist - [facility]  Previous medication trials:  Zoloft, Lexapro, Viibryd, Rexulti, Risperdal, [place], Lybalvi and others.     Review of Systems:  Review of Systems  Musculoskeletal:  Negative for gait problem.  Neurological:  Negative for tremors.  Psychiatric/Behavioral:         Please refer to HPI    Medications: I have reviewed the patient's current medications.  Current Outpatient Medications  Medication Sig Dispense Refill   buPROPion (WELLBUTRIN XL) 150 MG 24 hr tablet Take 1 tablet (150 mg total) by mouth daily. 30 tablet 2   chlorpheniramine (CHLOR-TRIMETON) 4 MG tablet Allergy Relief (Chlorpheniramine)     desvenlafaxine (PRISTIQ) 100 MG 24 hr tablet Take 1 tablet (100 mg total) by mouth daily. 90 tablet 1   medroxyPROGESTERone (DEPO-PROVERA) 150 MG/ML injection Inject 150 mg into the muscle every 3 (three) months.     QUEtiapine Fumarate (SEROQUEL XR) 150 MG 24 hr tablet Take one tablet at bedtime. 90 tablet 1   No current facility-administered medications for this visit.    Medication Side Effects: None  Allergies: No Known Allergies  Past Medical History:  Diagnosis Date   Depression    IBS (irritable bowel syndrome)     Family History  Problem Relation Age of Onset   Vision loss Mother  Alcohol abuse Father    Vision loss Father    Learning disabilities Sister    Mental retardation Sister    Asthma Brother    Lupus Paternal Grandmother    Colon cancer Neg Hx    Esophageal cancer Neg Hx    Liver disease Neg Hx     Social History   Socioeconomic History   Marital status: Single    Spouse name: Not on file   Number of children: Not on  file   Years of education: Not on file   Highest education level: Not on file  Occupational History   Occupation: Environmental consultant  Tobacco Use   Smoking status: Never   Smokeless tobacco: Never  Substance and Sexual Activity   Alcohol use: Yes    Alcohol/week: 1.0 standard drink of alcohol    Types: 1 drink(s) per week    Comment: soically   Drug use: Yes    Types: Marijuana   Sexual activity: Yes  Other Topics Concern   Not on file  Social History Narrative   Not on file   Social Determinants of Health   Financial Resource Strain: Not on file  Food Insecurity: Not on file  Transportation Needs: Not on file  Physical Activity: Not on file  Stress: Not on file  Social Connections: Not on file  Intimate Partner Violence: Not on file    Past Medical History, Surgical history, Social history, and Family history were reviewed and updated as appropriate.   Please see review of systems for further details on the patient's review from today.   Objective:   Physical Exam:  There were no vitals taken for this visit.  Physical Exam Constitutional:      General: She is not in acute distress. Musculoskeletal:        General: No deformity.  Neurological:     Mental Status: She is alert and oriented to person, place, and time.     Coordination: Coordination normal.  Psychiatric:        Attention and Perception: Attention and perception normal. She does not perceive auditory or visual hallucinations.        Mood and Affect: Mood normal. Mood is not anxious or depressed. Affect is not labile, blunt, angry or inappropriate.        Speech: Speech normal.        Behavior: Behavior normal.        Thought Content: Thought content normal. Thought content is not paranoid or delusional. Thought content does not include homicidal or suicidal ideation. Thought content does not include homicidal or suicidal plan.        Cognition and Memory: Cognition and memory normal.        Judgment:  Judgment normal.     Comments: Insight intact     Lab Review:     Component Value Date/Time   NA 139 06/09/2022 0929   K 3.9 06/09/2022 0929   CL 106 06/09/2022 0929   CO2 26 06/09/2022 0929   GLUCOSE 91 06/09/2022 0929   BUN 8 06/09/2022 0929   CREATININE 0.98 06/09/2022 0929   CREATININE 0.90 02/02/2017 1158   CALCIUM 9.0 06/09/2022 0929   PROT 7.3 06/09/2022 0929   ALBUMIN 4.2 06/09/2022 0929   AST 15 06/09/2022 0929   ALT 20 06/09/2022 0929   ALKPHOS 69 06/09/2022 0929   BILITOT 0.5 06/09/2022 0929   GFRNONAA 86 02/02/2017 1158   GFRAA >89 02/02/2017 1158       Component  Value Date/Time   WBC 6.4 06/09/2022 0929   RBC 4.52 06/09/2022 0929   HGB 12.8 06/09/2022 0929   HCT 38.7 06/09/2022 0929   PLT 356.0 06/09/2022 0929   MCV 85.8 06/09/2022 0929   MCH 29.0 02/02/2017 1158   MCHC 33.2 06/09/2022 0929   RDW 13.1 06/09/2022 0929   LYMPHSABS 2.4 06/09/2022 0929   MONOABS 0.3 06/09/2022 0929   EOSABS 0.4 06/09/2022 0929   BASOSABS 0.0 06/09/2022 0929    No results found for: "POCLITH", "LITHIUM"   No results found for: "PHENYTOIN", "PHENOBARB", "VALPROATE", "CBMZ"   .res Assessment: Plan:    Plan:  PDMP reviewed  Wellbutrin XL 150mg  every morning - genesight reviewed. Pristiq 100mg  daily Increase Seroquel XR 150mg  at hs   Time spent with patient was 25 minutes. Greater than 50% of face to face time with patient was spent on counseling and coordination of care - medication adjusted.    RTC 4 weeks  Patient advised to contact office with any questions, adverse effects, or acute worsening in signs and symptoms.  Discussed potential metabolic side effects associated with atypical antipsychotics, as well as potential risk for movement side effects. Advised pt to contact office if movement side effects occur.    Diagnoses and all orders for this visit:  Current episode of major depressive disorder without prior episode, unspecified depression episode  severity -     buPROPion (WELLBUTRIN XL) 150 MG 24 hr tablet; Take 1 tablet (150 mg total) by mouth daily.  GAD (generalized anxiety disorder)  Insomnia, unspecified type     Please see After Visit Summary for patient specific instructions.  No future appointments.   No orders of the defined types were placed in this encounter.     -------------------------------

## 2022-09-01 ENCOUNTER — Other Ambulatory Visit: Payer: Self-pay

## 2022-09-01 DIAGNOSIS — F329 Major depressive disorder, single episode, unspecified: Secondary | ICD-10-CM

## 2022-09-01 MED ORDER — QUETIAPINE FUMARATE ER 150 MG PO TB24: ORAL_TABLET | ORAL | 1 refills | Status: DC

## 2022-09-04 ENCOUNTER — Ambulatory Visit (INDEPENDENT_AMBULATORY_CARE_PROVIDER_SITE_OTHER): Payer: 59 | Admitting: Adult Health

## 2022-09-04 ENCOUNTER — Encounter: Payer: Self-pay | Admitting: Adult Health

## 2022-09-04 DIAGNOSIS — G4700 Insomnia, unspecified: Secondary | ICD-10-CM

## 2022-09-04 DIAGNOSIS — F329 Major depressive disorder, single episode, unspecified: Secondary | ICD-10-CM

## 2022-09-04 DIAGNOSIS — F411 Generalized anxiety disorder: Secondary | ICD-10-CM | POA: Diagnosis not present

## 2022-09-04 MED ORDER — DESVENLAFAXINE SUCCINATE ER 50 MG PO TB24: ORAL_TABLET | ORAL | 0 refills | Status: DC

## 2022-09-04 MED ORDER — DULOXETINE HCL 30 MG PO CPEP: ORAL_CAPSULE | ORAL | 2 refills | Status: DC

## 2022-09-04 MED ORDER — TRAZODONE HCL 100 MG PO TABS: ORAL_TABLET | ORAL | 2 refills | Status: DC

## 2022-09-04 NOTE — Progress Notes (Signed)
Monica Meyer [id number] 1985/09/04 36 y.o.  Subjective:   Patient ID:  Monica Meyer is a 37 y.o. (DOB Mar 13, 1986) female.  Chief Complaint: No chief complaint on file.   HPI Monica Meyer presents to the office today for follow-up of insomnia, GAD and MDD.  Describes mood today as "ok". Pleasant. Flat. Tearful at times. Mood symptoms - reports depression, anxiety and irritability. Reports worry, rumination and over thinking. Reports mood fluctuations. Feeling more "down" currently. Stating "I don't feel like my medications are working for me". Would like to taper off current medication regimen and consider other options. Varying interest and motivation. Taking medications as prescribed.  Energy levels lower. Active, does not have a regular exercise routine.   Enjoys some usual interests and activities. Single. Lives alone. Family in [place]. Spending time with family. Appetite decreased. Weight loss - 150+ pounds. Sleeps well most nights. Averages 8 hours with Seroquel. Focus and concentration is "lower". Completing tasks. Managing aspects of household. Work at [employer]. Denies SI or HI.  Denies AH or VH. 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    PHQ2-9    Flowsheet Row Office Visit from 06/06/2014 in [facility]  PHQ-2 Total Score 6  PHQ-9 Total Score 15        Review of Systems:  Review of Systems  Musculoskeletal:  Negative for gait problem.  Neurological:  Negative for tremors.  Psychiatric/Behavioral:         Please refer to HPI    Medications: I have reviewed the patient's current medications.  Current Outpatient Medications  Medication Sig Dispense Refill   DULoxetine (CYMBALTA) 30 MG capsule Take one tablet daily for 7 days, then increase to one tablet twice daily.  60 capsule 2   traZODone (DESYREL) 100 MG tablet Take one to two tablets at bedtime for sleep. 60 tablet 2   buPROPion (WELLBUTRIN XL) 150 MG 24 hr tablet Take 1 tablet (150 mg total) by mouth daily. 30 tablet 2   chlorpheniramine (CHLOR-TRIMETON) 4 MG tablet Allergy Relief (Chlorpheniramine)     desvenlafaxine (PRISTIQ) 50 MG 24 hr tablet Take one tablet daily for 7 days, then discontinue. 7 tablet 0   medroxyPROGESTERone (DEPO-PROVERA) 150 MG/ML injection Inject 150 mg into the muscle every 3 (three) months.     QUEtiapine Fumarate (SEROQUEL XR) 150 MG 24 hr tablet Take one tablet at bedtime. 90 tablet 1   No current facility-administered medications for this visit.    Medication Side Effects: None  Allergies: No Known Allergies  Past Medical History:  Diagnosis Date   Depression    IBS (irritable bowel syndrome)     Past Medical History, Surgical history, Social history, and Family history were reviewed and updated as appropriate.   Please see review of systems for further details on the patient's review from today.   Objective:   Physical Exam:  There were no vitals taken for this visit.  Physical Exam Constitutional:      General: She is not in acute distress. Musculoskeletal:        General: No deformity.  Neurological:     Mental Status: She is alert and oriented to person, place, and time.     Coordination: Coordination normal.  Psychiatric:        Attention and Perception: Attention and perception normal. She does not  perceive auditory or visual hallucinations.        Mood and Affect: Mood normal. Mood is not anxious or depressed. Affect is not labile, blunt, angry or inappropriate.        Speech: Speech normal.        Behavior: Behavior normal.        Thought Content: Thought content normal. Thought content is not paranoid or delusional. Thought content does not include homicidal or suicidal ideation. Thought content does not include homicidal or suicidal plan.         Cognition and Memory: Cognition and memory normal.        Judgment: Judgment normal.     Comments: Insight intact     Lab Review:     Component Value Date/Time   NA 139 06/09/2022 0929   K 3.9 06/09/2022 0929   CL 106 06/09/2022 0929   CO2 26 06/09/2022 0929   GLUCOSE 91 06/09/2022 0929   BUN 8 06/09/2022 0929   CREATININE 0.98 06/09/2022 0929   CREATININE 0.90 02/02/2017 1158   CALCIUM 9.0 06/09/2022 0929   PROT 7.3 06/09/2022 0929   ALBUMIN 4.2 06/09/2022 0929   AST 15 06/09/2022 0929   ALT 20 06/09/2022 0929   ALKPHOS 69 06/09/2022 0929   BILITOT 0.5 06/09/2022 0929   GFRNONAA 86 02/02/2017 1158   GFRAA >89 02/02/2017 1158       Component Value Date/Time   WBC 6.4 06/09/2022 0929   RBC 4.52 06/09/2022 0929   HGB 12.8 06/09/2022 0929   HCT 38.7 06/09/2022 0929   PLT 356.0 06/09/2022 0929   MCV 85.8 06/09/2022 0929   MCH 29.0 02/02/2017 1158   MCHC 33.2 06/09/2022 0929   RDW 13.1 06/09/2022 0929   LYMPHSABS 2.4 06/09/2022 0929   MONOABS 0.3 06/09/2022 0929   EOSABS 0.4 06/09/2022 0929   BASOSABS 0.0 06/09/2022 0929    No results found for: "POCLITH", "LITHIUM"   No results found for: "PHENYTOIN", "PHENOBARB", "VALPROATE", "CBMZ"   .res Assessment: Plan:    Plan:  PDMP reviewed  Add Cymbalta 30mg  daily x 7 days, then increase to 60mg  daily Add Trazadone 100mg  - 1 to 2 tabs at hs.  D/C Pristiq 100mg  daily - take 1/2 tablet daily x 7 days, then d/c D/C Seroquel XR 150mg  at hs D/C Wellbutrin XL 150mg  every morning - genesight reviewed.  Consider mood stabilizer - Lamictal  Time spent with patient was 25 minutes. Greater than 50% of face to face time with patient was spent on counseling and coordination of care - medication adjusted.    RTC 4 weeks  Patient advised to contact office with any questions, adverse effects, or acute worsening in signs and symptoms.  Discussed potential metabolic side effects associated with atypical antipsychotics,  as well as potential risk for movement side effects. Advised pt to contact office if movement side effects occur.    Diagnoses and all orders for this visit:  Insomnia, unspecified type -     traZODone (DESYREL) 100 MG tablet; Take one to two tablets at bedtime for sleep.  GAD (generalized anxiety disorder) -     desvenlafaxine (PRISTIQ) 50 MG 24 hr tablet; Take one tablet daily for 7 days, then discontinue. -     DULoxetine (CYMBALTA) 30 MG capsule; Take one tablet daily for 7 days, then increase to one tablet twice daily.  Current episode of major depressive disorder without prior episode, unspecified depression episode severity -     desvenlafaxine (PRISTIQ) 50  MG 24 hr tablet; Take one tablet daily for 7 days, then discontinue. -     DULoxetine (CYMBALTA) 30 MG capsule; Take one tablet daily for 7 days, then increase to one tablet twice daily.     Please see After Visit Summary for patient specific instructions.  No future appointments.  No orders of the defined types were placed in this encounter.   -------------------------------

## 2022-09-09 ENCOUNTER — Telehealth: Payer: Self-pay | Admitting: Adult Health

## 2022-09-09 NOTE — Telephone Encounter (Signed)
Pt having stomach issues. Started new med Friday and asking for return call @ [phone]

## 2022-09-10 NOTE — Telephone Encounter (Signed)
Pt informed

## 2022-09-10 NOTE — Telephone Encounter (Signed)
Pt stated she started Cymbalta on Friday and since then has been having stomach issues.I asked her what kind of symptoms she Is having and she just said she has stomach pain,but no diarrhea.Could this be a possible side effect?

## 2022-09-10 NOTE — Telephone Encounter (Signed)
Not a common side effect - it can cause some GI upset in patients. Is it a mild symptom - if so maybe give it more time

## 2022-09-15 ENCOUNTER — Telehealth: Payer: Self-pay | Admitting: Adult Health

## 2022-09-15 NOTE — Telephone Encounter (Signed)
Pt called about this last week and it did not get better.After taking the med she gets a pain in her stomach that lasts all day along with nausea,preventing her from eating.She did not take med today and has not had this problem.Is there another med she can try?

## 2022-09-15 NOTE — Telephone Encounter (Signed)
Jessicca called and said that she stopped Cymbalti today due to having serious stomach pain and nausea. Please call her at [phone]. Pharmacy is:  [facility] [place], [place] AT [address] [place] [address]     Phone: [phone]  Fax: [phone]

## 2022-09-15 NOTE — Telephone Encounter (Signed)
We had talked about Lamictal for depression. We can try that one - 25mg  at hs x 2 weeks, then 50mg  at hs. Remind about extra protection to prevent possible pregnancy. Also discuss potential rash.   Counseled patient regarding potential benefits, risks, and side effects of Lamictal to include potential risk of Stevens-Johnson syndrome. Advised patient to stop taking Lamictal and contact office immediately if rash develops and to seek urgent medical attention if rash is severe and/or spreading quickly. Will start Lamictal 25 mg daily for 2 weeks, then increase to 50 mg daily for 2 weeks.

## 2022-09-16 NOTE — Telephone Encounter (Signed)
That is fine 

## 2022-09-16 NOTE — Telephone Encounter (Signed)
Pulled samples and a savings card and notified patient.

## 2022-09-16 NOTE — Telephone Encounter (Signed)
Patient does not want to try Lamictal due to pregnancy concerns. She asks about trying Caplyta, said it was on her [facility] list.

## 2022-09-19 ENCOUNTER — Telehealth: Payer: Self-pay | Admitting: Adult Health

## 2022-09-19 NOTE — Telephone Encounter (Signed)
Noted. Pls call if she would like to discuss other options.

## 2022-09-19 NOTE — Telephone Encounter (Signed)
See message from patient. She doesn't seem to stay on any one medication long enough to achieve full benefit.

## 2022-09-19 NOTE — Telephone Encounter (Signed)
Pt called stating she may need to take a break from all these anti depressants. Been weeks since she's felt good. Advise pt @ [phone]

## 2022-09-19 NOTE — Telephone Encounter (Signed)
Patient notified. She said once she gets everything out of her system and gets straightened out she will reach back out.

## 2022-09-20 ENCOUNTER — Encounter (HOSPITAL_COMMUNITY): Payer: Self-pay | Admitting: Registered Nurse

## 2022-09-20 ENCOUNTER — Ambulatory Visit (HOSPITAL_COMMUNITY)
Admission: EM | Admit: 2022-09-20 | Discharge: 2022-09-20 | Disposition: A | Discharge status: Home / Self Care | Payer: 59 | Attending: Registered Nurse | Admitting: Registered Nurse

## 2022-09-20 DIAGNOSIS — T43205A Adverse effect of unspecified antidepressants, initial encounter: Secondary | ICD-10-CM | POA: Diagnosis not present

## 2022-09-20 DIAGNOSIS — F32A Depression, unspecified: Secondary | ICD-10-CM

## 2022-09-20 NOTE — ED Provider Notes (Signed)
Behavioral Health Urgent Care Medical Screening Exam  Patient Name: Raguel Kosloski MRN: [id number] Date of Evaluation: 09/20/22 Chief Complaint:   Diagnosis:  Final diagnoses:  Antidepressant discontinuation syndrome, initial encounter    History of Present illness: Babbie Dondlinger is a 37 y.o. female patient presented to [facility] as a walk in with complaints of brain zaps from medication withdrawal  Darden Dates, 37 y.o., female patient seen face to face by this provider, consulted with Dr. Hampton Abbot; and chart reviewed on 09/20/22.  On evaluation Tenesha Gutkowski reports she goes to [facility] for outpatient psychiatric services.  She states she has been on Pristiq for years and was recently (09/04/22) switched to Cymbalta because Pristiq wasn't working any more.  States that she took Cymbalta for 1.5 weeks and stopped related to it causing her GI problems.  She was then switched to [place].  States that she did not take the [place] last night related to not felling well "I've been have these brain zaps and I'm not feeling well.  I still feel nauseous."  Patient states she has spoken to her psychiatric provider and informed that she was quitting "I told her I felt like I needed a break and she said okay."  Patient states she was tapered off of the Pristiq while starting on the Cymbalta.  Patient wanted to know what could be done about the brain zaps.  Educated on cause and that there was no known treatment but there were suggestions that could help.  Patient also asked if she has seen her primary care provider to rule out any medical causes.  I been taking antidepressant medications for years.  I know how to do research and I know my reaction and when symptoms started."  Patient again informed that there could be medical problem and that she should at least follow up with PCP to rule out.    Patient states that she is currently in therapy and sees therapist every 2 months.  Encouraged to  increase frequency that she sees therapist.  Talking to somebody ain't going to help these brain zaps and if I wanted it more frequently I could.  I'm the one that set it up for every 2 months.  States she can't see PCP until 10/10/21 and that there is a possibility she has IBS but won't know for sure until then. Patient denies suicidal/self-harm/homicidal ideation.   During evaluation Frederick Pauling is sitting in chair with no noted distress.  She is alert/oriented x 4, calm, cooperative, attentive but irritable.  She became more irritable when she was informed that there was no specific treatment for brain zaps and the encouragement to see her PCP and follow up with her psychiatric provider.  Her responses were relevant and appropriate to assessment questions.  He spoke in a clear tone at moderate volume, and normal pace, with good eye contact.   She denies suicidal/self-harm/homicidal ideation, psychosis, and paranoia.  Objectively:  there is no evidence of psychosis/mania or delusional thinking.  She conversed coherently, with goal directed thoughts, and no distractibility, or pre-occupation and he has denied suicidal/self-harm/homicidal ideation, psychosis, and paranoia.    [place] ED from 09/20/2022 in [place] [facility]       Psychiatric Specialty Exam  Presentation  General Appearance:Appropriate for Environment; Casual  Eye Contact:Good  Speech:Clear and Coherent; Normal Rate  Speech Volume:Normal  Handedness:Right   Mood and Affect  Mood: Dysphoric; Irritable  Affect: Congruent  Thought Process  Thought Processes: Coherent; Goal Directed  Descriptions of Associations:Intact  Orientation:Full (Time, Place and Person)  Thought Content:Logical    Hallucinations:None  Ideas of Reference:None  Suicidal Thoughts:No  Homicidal Thoughts:No   Sensorium  Memory: Immediate Good; Recent Good; Remote  Good  Judgment: Intact  Insight: Present   Executive Functions  Concentration: Good  Attention Span: Good  Recall: Good  Fund of Knowledge: Good  Language: Good   Psychomotor Activity  Psychomotor Activity: Normal   Assets  Assets: Communication Skills; Desire for Improvement; Financial Resources/Insurance; Housing; Leisure Time; Social Support; Transportation   Sleep  Sleep: [place]  Number of hours: No data recorded  Physical Exam: Physical Exam Vitals and nursing note reviewed.  Constitutional:      General: She is not in acute distress.    Appearance: Normal appearance. She is not ill-appearing.  HENT:     Head: Normocephalic.  Eyes:     Conjunctiva/sclera: Conjunctivae normal.  Cardiovascular:     Rate and Rhythm: Normal rate.  Pulmonary:     Effort: Pulmonary effort is normal. No respiratory distress.  Musculoskeletal:        General: Normal range of motion.     Cervical back: Normal range of motion.  Skin:    General: Skin is warm and dry.  Neurological:     Mental Status: She is alert and oriented to person, place, and time.  Psychiatric:        Attention and Perception: Attention and perception normal. She does not perceive auditory or visual hallucinations.        Mood and Affect: Mood is depressed. Affect is angry.        Speech: Speech normal.        Behavior: Behavior is agitated.        Thought Content: Thought content normal. Thought content is not paranoid or delusional. Thought content does not include homicidal or suicidal ideation.        Cognition and Memory: Cognition normal.        Judgment: Judgment normal.    Review of Systems  Constitutional:  Negative for chills and fever.  HENT:  Negative for sore throat.   Eyes: Negative.   Respiratory:  Negative for cough and wheezing.   Cardiovascular:  Negative for chest pain.  Neurological:  Negative for seizures.       Reporting brain zaps.  States that since she has come  off of her antidepressant she has been having brain zaps that feels like electrical shocks to her brain.    Psychiatric/Behavioral:  Positive for depression. Negative for hallucinations and substance abuse. Suicidal ideas: Denies.The patient does not have insomnia. Nervous/anxious: Stable.  All other systems reviewed and are negative.  Blood pressure (!) 115/91, pulse 81, temperature 98 F (36.7 C), temperature source Oral, resp. rate 17. There is no height or weight on file to calculate BMI.  Musculoskeletal: Strength & Muscle Tone: within normal limits Gait & Station: normal Patient leans: N/A   [facility]ish [facility] MSE Discharge Disposition for Follow up and Recommendations: Based on my evaluation the patient does not appear to have an emergency medical condition and can be discharged with resources and follow up care in outpatient services for Medication Management and Individual Therapy   Follow-up Information     Call  [facility].   Why: Follow up with current provider for medication management Contact information: [address] [phone]  Discharge Instructions      Antidepressant discontinuation syndrome:  One of the symptoms that can occur are brain zaps which are described as electrical shock sensations or electrical current zapping brain, which can be very uncomfortable and frighting to someone.    Antidepressant withdrawal: During withdrawal from antidepressant medications, "brain zaps" are considered common symptoms to experience. It is believed that the severity and length of brain zaps may be related to whether a person discontinues "cold [place]" as opposed to tapering off of their medication.  How to stop Brain Zaps There are no known medical treatments to stop the brain zaps. Most often individuals may have to them with the understanding that in time they will eventually subside. Below are some  recommendations that may help deal with the zaps.  Slower tapper:  If quit medication cold [place] may need to start taking again, and then conduct a slower/gradual taper off.  Often zaps are caused when an individual quits medication too quickly and/or from to high of a dose.    Go back on medication:  Some individuals choose to go back on medications.  You can then decide if you wish to taper off or switch to a different medication.  You provider may also choose to add an adjunct to current medications.    Prozac:  has a longer half-life.  Another way to minimize brain zaps is to transition to another drug that has a longer half-life.  Your provider could start Prozac during the taper off of your current antidepressant, and then later stop.  Withdrawing from the Prozac should reduce chances of zaps.   Vitamins and Supplements:  Various supplements have been reported to reduce the severity of the brain zaps.  However, it has not been verified that these supplements actually work to stop/reduce the zaps. Vitamins and Omega-3 fatty acids:  Individuals have reported that there is a significant improvement with Omega-3 fatty acids in the form of "fish oil" Vitamin B12 It has been suggested that getting vitamins help to minimize the zaps.   A combination of Vitamin B12 and Omega-3 fatty acid has been reported to decrease the severity and frequency of zaps in some individuals.      Captola Teschner, NP 09/20/2022, 2:54 PM

## 2022-09-20 NOTE — Progress Notes (Signed)
   09/20/22 1350  [place] (Walk-ins at [facility] only)  How Did You Hear About [place]? Self  What Is the Reason for Your Visit/Call Today? Patient presents as a voluntary walk in to [facility] requesting assistance with locating a psychiatric provider to assist with medication managment. Patient denies any S/I, H/I or AVH. Patient reports she recevies OP services from [place] NP at her [facility] who has been prescribing medications (See [facility]) for management of symptoms associated with depression. Patient reports she is very frustrated that she has been on several medication over the last few months and she states she has had side effects that has caused her to discontinue all her current medications. Patient is requesting assistance this date with possible medication managment and referrals  How Long Has This Been Causing You Problems? 1-6 months  Have You Recently Had Any Thoughts About Hurting Yourself? No  Are You Planning to Commit Suicide/Harm Yourself At This time? No  Have you Recently Had Thoughts About [place]? No  Are You Planning To Harm Someone At This Time? No  Are you currently experiencing any auditory, visual or other hallucinations? No  Have You Used Any Alcohol or Drugs in the Past 24 Hours? No  Do you have any current medical co-morbidities that require immediate attention? No  Clinician description of patient physical appearance/behavior: Patient presents anxious and irratable. Patient states she is upset over security taking all her belongings. This Probation officer explained the reasoning for those protocols.  What Do You Feel Would Help You the Most Today? Medication(s)  If access to [facility] was not available, would you have sought care in the Emergency Department? No  Determination of Need Routine (7 days)  Options For Referral Outpatient Therapy

## 2022-09-20 NOTE — Discharge Instructions (Addendum)
Antidepressant discontinuation syndrome:  One of the symptoms that can occur are brain zaps which are described as electrical shock sensations or electrical current zapping brain, which can be very uncomfortable and frighting to someone.    Antidepressant withdrawal: During withdrawal from antidepressant medications, "brain zaps" are considered common symptoms to experience. It is believed that the severity and length of brain zaps may be related to whether a person discontinues "cold [place]" as opposed to tapering off of their medication.  How to stop Brain Zaps There are no known medical treatments to stop the brain zaps. Most often individuals may have to them with the understanding that in time they will eventually subside. Below are some recommendations that may help deal with the zaps.  Slower tapper:  If quit medication cold [place] may need to start taking again, and then conduct a slower/gradual taper off.  Often zaps are caused when an individual quits medication too quickly and/or from to high of a dose.    Go back on medication:  Some individuals choose to go back on medications.  You can then decide if you wish to taper off or switch to a different medication.  You provider may also choose to add an adjunct to current medications.    Prozac:  has a longer half-life.  Another way to minimize brain zaps is to transition to another drug that has a longer half-life.  Your provider could start Prozac during the taper off of your current antidepressant, and then later stop.  Withdrawing from the Prozac should reduce chances of zaps.   Vitamins and Supplements:  Various supplements have been reported to reduce the severity of the brain zaps.  However, it has not been verified that these supplements actually work to stop/reduce the zaps. Vitamins and Omega-3 fatty acids:  Individuals have reported that there is a significant improvement with Omega-3 fatty acids in the form of "fish oil" Vitamin B12  It has been suggested that getting vitamins help to minimize the zaps.   A combination of Vitamin B12 and Omega-3 fatty acid has been reported to decrease the severity and frequency of zaps in some individuals.

## 2022-09-25 ENCOUNTER — Other Ambulatory Visit: Payer: Self-pay

## 2022-09-25 ENCOUNTER — Telehealth: Payer: Self-pay

## 2022-09-25 MED ORDER — DESVENLAFAXINE SUCCINATE ER 25 MG PO TB24: 25.0000 mg | ORAL_TABLET | Freq: Every day | ORAL | 0 refills | Status: DC

## 2022-09-25 NOTE — Telephone Encounter (Signed)
Rx sent 

## 2022-09-25 NOTE — Telephone Encounter (Signed)
Let's offer her a 25mg  of Pristiq for now, it may help with the withdrawal symptoms and we can d/c at another time.

## 2022-10-09 ENCOUNTER — Ambulatory Visit (INDEPENDENT_AMBULATORY_CARE_PROVIDER_SITE_OTHER): Payer: 59 | Admitting: Adult Health

## 2022-10-09 ENCOUNTER — Encounter: Payer: Self-pay | Admitting: Adult Health

## 2022-10-09 DIAGNOSIS — G4700 Insomnia, unspecified: Secondary | ICD-10-CM

## 2022-10-09 DIAGNOSIS — F411 Generalized anxiety disorder: Secondary | ICD-10-CM | POA: Diagnosis not present

## 2022-10-09 DIAGNOSIS — F329 Major depressive disorder, single episode, unspecified: Secondary | ICD-10-CM

## 2022-10-09 MED ORDER — DESVENLAFAXINE SUCCINATE ER 25 MG PO TB24: 25.0000 mg | ORAL_TABLET | Freq: Every day | ORAL | 0 refills | Status: DC

## 2022-10-09 NOTE — Progress Notes (Signed)
Mykale Levatino [id number] [birth year] 37 y.o.  Subjective:   Patient ID:  Monica Meyer is a 37 y.o. (DOB 01/26/1986) female.  Chief Complaint: No chief complaint on file.   HPI Monica Meyer presents to the office today for follow-up of insomnia, GAD and MDD.  Describes mood today as "ok". Pleasant. Flat. Tearful at times. Mood symptoms - reports decreased depression, anxiety and irritability. Reports decreased worry, rumination and over thinking. Reports mood fluctuations.  Stating "I feel like I'm doing alright for now". Feels like the Pristiq and Trazadone are helpful. Varying interest and motivation. Taking medications as prescribed.  Energy levels lower - "always feels tired". Active, does not have a regular exercise routine.   Enjoys some usual interests and activities. Single. Lives alone. Family in [place]. Spending time with family. Appetite decreased. Weight loss - 150+ pounds. Sleeps well most nights. Averages 8 hours with Trazadone. Focus and concentration is "ok". Completing tasks. Managing aspects of household. Work at [employer]. Denies SI or HI.  Denies AH or VH. Denies self harm THC - daily use ETOH use - socially  Ivonne Andrew therapist - [facility]  Previous medication trials:  Zoloft, Lexapro, Viibryd, [place], Risperdal, [place], Lybalvi and others.      [place] Office Visit from 06/06/2014 in [place]  PHQ-2 Total Score 6  PHQ-9 Total Score 15        Review of Systems:  Review of Systems  Musculoskeletal:  Negative for gait problem.  Neurological:  Negative for tremors.  Psychiatric/Behavioral:         Please refer to HPI    Medications: I have reviewed the patient's current medications.  Current Outpatient Medications  Medication Sig Dispense Refill   buPROPion (WELLBUTRIN XL) 150 MG 24 hr tablet Take 1 tablet (150 mg total) by mouth daily. 30 tablet 2   chlorpheniramine (CHLOR-TRIMETON)  4 MG tablet Allergy Relief (Chlorpheniramine)     desvenlafaxine (PRISTIQ) 25 MG 24 hr tablet Take 1 tablet (25 mg total) by mouth daily. 30 tablet 0   DULoxetine (CYMBALTA) 30 MG capsule Take one tablet daily for 7 days, then increase to one tablet twice daily. 60 capsule 2   medroxyPROGESTERone (DEPO-PROVERA) 150 MG/ML injection Inject 150 mg into the muscle every 3 (three) months.     QUEtiapine Fumarate (SEROQUEL XR) 150 MG 24 hr tablet Take one tablet at bedtime. 90 tablet 1   traZODone (DESYREL) 100 MG tablet Take one to two tablets at bedtime for sleep. 60 tablet 2   No current facility-administered medications for this visit.    Medication Side Effects: None  Allergies: No Known Allergies  Past Medical History:  Diagnosis Date   Depression    IBS (irritable bowel syndrome)     Past Medical History, Surgical history, Social history, and Family history were reviewed and updated as appropriate.   Please see review of systems for further details on the patient's review from today.   Objective:   Physical Exam:  There were no vitals taken for this visit.  Physical Exam Constitutional:      General: She is not in acute distress. Musculoskeletal:        General: No deformity.  Neurological:     Mental Status: She is alert and oriented to person, place, and time.     Coordination: Coordination normal.  Psychiatric:        Attention and Perception: Attention and perception normal. She does not perceive  auditory or visual hallucinations.        Mood and Affect: Mood normal. Mood is not anxious or depressed. Affect is not labile, blunt, angry or inappropriate.        Speech: Speech normal.        Behavior: Behavior normal.        Thought Content: Thought content normal. Thought content is not paranoid or delusional. Thought content does not include homicidal or suicidal ideation. Thought content does not include homicidal or suicidal plan.        Cognition and Memory:  Cognition and memory normal.        Judgment: Judgment normal.     Comments: Insight intact     Lab Review:     Component Value Date/Time   NA 139 06/09/2022 0929   K 3.9 06/09/2022 0929   CL 106 06/09/2022 0929   CO2 26 06/09/2022 0929   GLUCOSE 91 06/09/2022 0929   BUN 8 06/09/2022 0929   CREATININE 0.98 06/09/2022 0929   CREATININE 0.90 02/02/2017 1158   CALCIUM 9.0 06/09/2022 0929   PROT 7.3 06/09/2022 0929   ALBUMIN 4.2 06/09/2022 0929   AST 15 06/09/2022 0929   ALT 20 06/09/2022 0929   ALKPHOS 69 06/09/2022 0929   BILITOT 0.5 06/09/2022 0929   GFRNONAA 86 02/02/2017 1158   GFRAA >89 02/02/2017 1158       Component Value Date/Time   WBC 6.4 06/09/2022 0929   RBC 4.52 06/09/2022 0929   HGB 12.8 06/09/2022 0929   HCT 38.7 06/09/2022 0929   PLT 356.0 06/09/2022 0929   MCV 85.8 06/09/2022 0929   MCH 29.0 02/02/2017 1158   MCHC 33.2 06/09/2022 0929   RDW 13.1 06/09/2022 0929   LYMPHSABS 2.4 06/09/2022 0929   MONOABS 0.3 06/09/2022 0929   EOSABS 0.4 06/09/2022 0929   BASOSABS 0.0 06/09/2022 0929    No results found for: "POCLITH", "LITHIUM"   No results found for: "PHENYTOIN", "PHENOBARB", "VALPROATE", "CBMZ"   .res Assessment: Plan:   Plan:  PDMP reviewed  Trazadone 138m - 1 to 2 tabs at hs. Pristiq 28mdaily   Time spent with patient was 25 minutes. Greater than 50% of face to face time with patient was spent on counseling and coordination of care - medication adjusted.    RTC 4 weeks  Patient advised to contact office with any questions, adverse effects, or acute worsening in signs and symptoms.  Discussed potential metabolic side effects associated with atypical antipsychotics, as well as potential risk for movement side effects. Advised pt to contact office if movement side effects occur.   There are no diagnoses linked to this encounter.   Please see After Visit Summary for patient specific instructions.  No future appointments.   No  orders of the defined types were placed in this encounter.   -------------------------------

## 2023-01-20 ENCOUNTER — Ambulatory Visit (INDEPENDENT_AMBULATORY_CARE_PROVIDER_SITE_OTHER): Payer: 59 | Admitting: Adult Health

## 2023-01-20 ENCOUNTER — Encounter: Payer: Self-pay | Admitting: Adult Health

## 2023-01-20 DIAGNOSIS — F411 Generalized anxiety disorder: Secondary | ICD-10-CM

## 2023-01-20 DIAGNOSIS — F339 Major depressive disorder, recurrent, unspecified: Secondary | ICD-10-CM

## 2023-01-20 DIAGNOSIS — G4700 Insomnia, unspecified: Secondary | ICD-10-CM | POA: Diagnosis not present

## 2023-01-20 NOTE — Progress Notes (Signed)
Monica Meyer [id number] [birth year] 37 y.o.  Subjective:   Patient ID:  Monica Meyer is a 37 y.o. (DOB [birth year]) female.  Chief Complaint: No chief complaint on file.   HPI Monica Meyer presents to the office today for follow-up of insomnia, GAD and MDD.  Describes mood today as "ok". Pleasant. Flat. Tearful at times. Mood symptoms - reports increased depression, anxiety and irritability - more depressed overall. Reports worry, rumination and over thinking. Reports mood fluctuations. Stating "things are bad". Reports being off of medications for several months and did not feel like it was helpful. Has looked into Spravato and would like to consider it as an option. Decreased interest and motivation. Taking medications as prescribed.  Energy levels lower. Active, does not have a regular exercise routine.   Enjoys some usual interests and activities. Single. Lives alone. Family in [place]. Spending time with family. Appetite decreased. Weight loss - 150+ pounds. Sleeps well most nights. Averages 5 to 8 hours - using Trazadone as needed. Focus and concentration is "ok". Completing tasks. Managing aspects of household. Work at [employer]. Denies SI or HI.  Denies AH or VH. Denies self harm ETOH use - socially  Valentina Lucks therapist - [facility]  Previous medication trials:  Zoloft, Lexapro, Viibryd, Rexulti, Risperdal, [place], [place] and others.    PHQ2-9    Flowsheet Row Office Visit from 06/06/2014 in [facility]  PHQ-2 Total Score 6  PHQ-9 Total Score 15        Review of Systems:  Review of Systems  Musculoskeletal:  Negative for gait problem.  Neurological:  Negative for tremors.  Psychiatric/Behavioral:         Please refer to HPI    Medications: I have reviewed the patient's current medications.  Current Outpatient Medications  Medication Sig Dispense Refill   buPROPion (WELLBUTRIN XL) 150 MG 24 hr tablet Take 1 tablet (150  mg total) by mouth daily. 30 tablet 2   chlorpheniramine (CHLOR-TRIMETON) 4 MG tablet Allergy Relief (Chlorpheniramine)     desvenlafaxine (PRISTIQ) 25 MG 24 hr tablet Take 1 tablet (25 mg total) by mouth daily. 30 tablet 0   DULoxetine (CYMBALTA) 30 MG capsule Take one tablet daily for 7 days, then increase to one tablet twice daily. 60 capsule 2   medroxyPROGESTERone (DEPO-PROVERA) 150 MG/ML injection Inject 150 mg into the muscle every 3 (three) months.     QUEtiapine Fumarate (SEROQUEL XR) 150 MG 24 hr tablet Take one tablet at bedtime. 90 tablet 1   traZODone (DESYREL) 100 MG tablet Take one to two tablets at bedtime for sleep. 60 tablet 2   No current facility-administered medications for this visit.    Medication Side Effects: None  Allergies: No Known Allergies  Past Medical History:  Diagnosis Date   Depression    IBS (irritable bowel syndrome)     Past Medical History, Surgical history, Social history, and Family history were reviewed and updated as appropriate.   Please see review of systems for further details on the patient's review from today.   Objective:   Physical Exam:  There were no vitals taken for this visit.  Physical Exam  Lab Review:     Component Value Date/Time   NA 139 06/09/2022 0929   K 3.9 06/09/2022 0929   CL 106 06/09/2022 0929   CO2 26 06/09/2022 0929   GLUCOSE 91 06/09/2022 0929   BUN 8 06/09/2022 0929   CREATININE 0.98 06/09/2022 0929   CREATININE 0.90  02/02/2017 1158   CALCIUM 9.0 06/09/2022 0929   PROT 7.3 06/09/2022 0929   ALBUMIN 4.2 06/09/2022 0929   AST 15 06/09/2022 0929   ALT 20 06/09/2022 0929   ALKPHOS 69 06/09/2022 0929   BILITOT 0.5 06/09/2022 0929   GFRNONAA 86 02/02/2017 1158   GFRAA >89 02/02/2017 1158       Component Value Date/Time   WBC 6.4 06/09/2022 0929   RBC 4.52 06/09/2022 0929   HGB 12.8 06/09/2022 0929   HCT 38.7 06/09/2022 0929   PLT 356.0 06/09/2022 0929   MCV 85.8 06/09/2022 0929   MCH 29.0  02/02/2017 1158   MCHC 33.2 06/09/2022 0929   RDW 13.1 06/09/2022 0929   LYMPHSABS 2.4 06/09/2022 0929   MONOABS 0.3 06/09/2022 0929   EOSABS 0.4 06/09/2022 0929   BASOSABS 0.0 06/09/2022 0929    No results found for: "POCLITH", "LITHIUM"   No results found for: "PHENYTOIN", "PHENOBARB", "VALPROATE", "CBMZ"   .res Assessment: Plan:    Plan:  PDMP reviewed  Trazadone 100mg  - 1 to 2 tabs at hs. Pristiq 25mg  daily   Time spent with patient was 25 minutes. Greater than 50% of face to face time with patient was spent on counseling and coordination of care - medication adjusted.    RTC 4 weeks  Patient advised to contact office with any questions, adverse effects, or acute worsening in signs and symptoms.  Discussed potential metabolic side effects associated with atypical antipsychotics, as well as potential risk for movement side effects. Advised pt to contact office if movement side effects occur.   There are no diagnoses linked to this encounter.   Please see After Visit Summary for patient specific instructions.  Future Appointments  Date Time Provider Department Center  01/20/2023  5:20 PM Cindy Fullman, Thereasa Solo, NP [facility] None  03/03/2023  3:00 PM Zehr, Princella Pellegrini, PA-C [facility]    No orders of the defined types were placed in this encounter.   -------------------------------

## 2023-02-11 ENCOUNTER — Telehealth: Payer: Self-pay

## 2023-02-11 NOTE — Telephone Encounter (Signed)
Prior Authorization submitted and approved for Spravato 56 mg, Pharmacy benefits with [employer] effective 01/11/2023-02/10/2024, PA# [id number]  Pt notified and offered dates to start the week of June 8th, but pt reports she does not have a ride home. Explained to her this is required to be able to have Spravato treatments. Suggested that after her first two treatments she could use uber on the way home. Suggested she leave her car here and have someone pick her up, she did not want to leave her car at the office parking lot. She reports her parents are elderly and can't pick her up. Informed her that this is part of the commitment.  She request to discuss things with Rene Kocher.  Rene Kocher is aware of the situation.

## 2023-03-03 ENCOUNTER — Ambulatory Visit: Payer: 59 | Admitting: Gastroenterology

## 2023-03-04 ENCOUNTER — Other Ambulatory Visit: Payer: Self-pay

## 2023-03-04 MED ORDER — VENLAFAXINE HCL ER 37.5 MG PO CP24: 37.5000 mg | ORAL_CAPSULE | Freq: Every day | ORAL | 0 refills | Status: DC

## 2023-04-02 ENCOUNTER — Ambulatory Visit: Payer: 59 | Admitting: Adult Health

## 2023-04-02 ENCOUNTER — Encounter: Payer: Self-pay | Admitting: Adult Health

## 2023-04-02 DIAGNOSIS — G4700 Insomnia, unspecified: Secondary | ICD-10-CM | POA: Diagnosis not present

## 2023-04-02 DIAGNOSIS — F339 Major depressive disorder, recurrent, unspecified: Secondary | ICD-10-CM

## 2023-04-02 DIAGNOSIS — F411 Generalized anxiety disorder: Secondary | ICD-10-CM

## 2023-04-02 MED ORDER — TRAZODONE HCL 50 MG PO TABS: ORAL_TABLET | ORAL | 5 refills | Status: DC

## 2023-04-02 MED ORDER — VENLAFAXINE HCL ER 75 MG PO CP24: 75.0000 mg | ORAL_CAPSULE | Freq: Every day | ORAL | 5 refills | Status: DC

## 2023-04-02 NOTE — Progress Notes (Signed)
Monica Meyer [id number] 05/11/1986 37 y.o.  Subjective:   Patient ID:  Monica Meyer is a 37 y.o. (DOB 20-May-1986) female.  Chief Complaint: No chief complaint on file.   HPI Monica Meyer presents to the office today for follow-up of insomnia, GAD and MDD.  Describes mood today as "ok". Pleasant. Flat. Tearful at times. Mood symptoms - reports decreased depression, anxiety and irritability - "I feel mellow right now". Denies panic attacks. Reports worry, rumination and over thinking. Reports mood as "up and down". Stating "I feel I'm doing ok". Feels like current medication regimen is helpful. Varying interest and motivation. Taking medications as prescribed.  Energy levels lower. Active, has a regular exercise routine.   Enjoys some usual interests and activities. Single. Lives alone. Family in [place]. Spending time with family. Appetite decreased. Weight loss - 150+ pounds. Sleeps well most nights. Averages 5 to 8 hours - using Trazadone as needed. Focus and concentration is "ok". Completing tasks. Managing aspects of household. Work at [employer]. Denies SI or HI.  Denies AH or VH. 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    PHQ2-9    Flowsheet Row Office Visit from 06/06/2014 in [facility]  PHQ-2 Total Score 6  PHQ-9 Total Score 15        Review of Systems:  Review of Systems  Musculoskeletal:  Negative for gait problem.  Neurological:  Negative for tremors.  Psychiatric/Behavioral:         Please refer to HPI    Medications: I have reviewed the patient's current medications.  Current Outpatient Medications  Medication Sig Dispense Refill   chlorpheniramine (CHLOR-TRIMETON) 4 MG tablet Allergy Relief (Chlorpheniramine)     desvenlafaxine (PRISTIQ) 25 MG 24 hr tablet Take 1 tablet (25 mg  total) by mouth daily. 30 tablet 0   DULoxetine (CYMBALTA) 30 MG capsule Take one tablet daily for 7 days, then increase to one tablet twice daily. 60 capsule 2   medroxyPROGESTERone (DEPO-PROVERA) 150 MG/ML injection Inject 150 mg into the muscle every 3 (three) months.     QUEtiapine Fumarate (SEROQUEL XR) 150 MG 24 hr tablet Take one tablet at bedtime. 90 tablet 1   traZODone (DESYREL) 100 MG tablet Take one to two tablets at bedtime for sleep. 60 tablet 2   venlafaxine XR (EFFEXOR-XR) 37.5 MG 24 hr capsule Take 1-2 capsules (37.5-75 mg total) by mouth daily with breakfast. 60 capsule 0   No current facility-administered medications for this visit.    Medication Side Effects: None  Allergies: No Known Allergies  Past Medical History:  Diagnosis Date   Depression    IBS (irritable bowel syndrome)     Past Medical History, Surgical history, Social history, and Family history were reviewed and updated as appropriate.   Please see review of systems for further details on the patient's review from today.   Objective:   Physical Exam:  There were no vitals taken for this visit.  Physical Exam Constitutional:      General: She is not in acute distress. Musculoskeletal:        General: No deformity.  Neurological:     Mental Status: She is alert and oriented to person, place, and time.     Coordination: Coordination normal.  Psychiatric:        Attention and Perception: Attention and perception normal. She does not  perceive auditory or visual hallucinations.        Mood and Affect: Mood normal. Mood is not anxious or depressed. Affect is not labile, blunt, angry or inappropriate.        Speech: Speech normal.        Behavior: Behavior normal.        Thought Content: Thought content normal. Thought content is not paranoid or delusional. Thought content does not include homicidal or suicidal ideation. Thought content does not include homicidal or suicidal plan.        Cognition  and Memory: Cognition and memory normal.        Judgment: Judgment normal.     Comments: Insight intact     Lab Review:     Component Value Date/Time   NA 139 06/09/2022 0929   K 3.9 06/09/2022 0929   CL 106 06/09/2022 0929   CO2 26 06/09/2022 0929   GLUCOSE 91 06/09/2022 0929   BUN 8 06/09/2022 0929   CREATININE 0.98 06/09/2022 0929   CREATININE 0.90 02/02/2017 1158   CALCIUM 9.0 06/09/2022 0929   PROT 7.3 06/09/2022 0929   ALBUMIN 4.2 06/09/2022 0929   AST 15 06/09/2022 0929   ALT 20 06/09/2022 0929   ALKPHOS 69 06/09/2022 0929   BILITOT 0.5 06/09/2022 0929   GFRNONAA 86 02/02/2017 1158   GFRAA >89 02/02/2017 1158       Component Value Date/Time   WBC 6.4 06/09/2022 0929   RBC 4.52 06/09/2022 0929   HGB 12.8 06/09/2022 0929   HCT 38.7 06/09/2022 0929   PLT 356.0 06/09/2022 0929   MCV 85.8 06/09/2022 0929   MCH 29.0 02/02/2017 1158   MCHC 33.2 06/09/2022 0929   RDW 13.1 06/09/2022 0929   LYMPHSABS 2.4 06/09/2022 0929   MONOABS 0.3 06/09/2022 0929   EOSABS 0.4 06/09/2022 0929   BASOSABS 0.0 06/09/2022 0929    No results found for: "POCLITH", "LITHIUM"   No results found for: "PHENYTOIN", "PHENOBARB", "VALPROATE", "CBMZ"   .res Assessment: Plan:    Plan:  PDMP reviewed  Trazadone 50mg  - take 1.5 tabs at hs. Effexor 75mg  daily  RTC 6 months  Patient advised to contact office with any questions, adverse effects, or acute worsening in signs and symptoms.  Discussed potential metabolic side effects associated with atypical antipsychotics, as well as potential risk for movement side effects. Advised pt to contact office if movement side effects occur.   There are no diagnoses linked to this encounter.   Please see After Visit Summary for patient specific instructions.  Future Appointments  Date Time Provider Department Center  04/02/2023  8:00 AM Brainard Highfill, Thereasa Solo, NP [facility] None    No orders of the defined types were placed in this  encounter.   -------------------------------

## 2023-04-29 ENCOUNTER — Telehealth: Payer: Self-pay | Admitting: Adult Health

## 2023-04-29 NOTE — Telephone Encounter (Signed)
Patient called in stating that she has been having back to back panic attacks. She would like to know what she can do about this. Questioned if she wanted to make an appt or be discuss medication options and she said that it was Gina's job to tell her that. Ph: [phone]

## 2023-04-29 NOTE — Telephone Encounter (Signed)
Please call patient to schedule an earlier appt with Barnett Applebaum.

## 2023-05-01 NOTE — Telephone Encounter (Signed)
Patient has FU on Monday. She said anxiety varies, but some days it brings her to her knees. Her depression is 10/10, but she is not suicidal. She has trouble sleeping - it varies from not being able to get to sleep to getting to sleep and not being able to stay asleep. She doesn't feel like the medication is treating the depression. She did not provide details, but said there are a lot of stressors currently when asked. Affect is flat, irritable.

## 2023-05-04 ENCOUNTER — Encounter: Payer: Self-pay | Admitting: Adult Health

## 2023-05-04 ENCOUNTER — Ambulatory Visit (INDEPENDENT_AMBULATORY_CARE_PROVIDER_SITE_OTHER): Payer: 59 | Admitting: Adult Health

## 2023-05-04 DIAGNOSIS — G4700 Insomnia, unspecified: Secondary | ICD-10-CM

## 2023-05-04 DIAGNOSIS — F411 Generalized anxiety disorder: Secondary | ICD-10-CM

## 2023-05-04 DIAGNOSIS — F339 Major depressive disorder, recurrent, unspecified: Secondary | ICD-10-CM

## 2023-05-04 MED ORDER — VENLAFAXINE HCL ER 150 MG PO CP24: 150.0000 mg | ORAL_CAPSULE | Freq: Every day | ORAL | 5 refills | Status: DC

## 2023-05-04 MED ORDER — ALPRAZOLAM 0.25 MG PO TABS: 0.2500 mg | ORAL_TABLET | Freq: Every evening | ORAL | 2 refills | Status: DC | PRN

## 2023-05-04 NOTE — Progress Notes (Signed)
Monica Meyer [id number] [birth year] 37 y.o.  Subjective:   Patient ID:  Monica Meyer is a 37 y.o. (DOB 1985-09-08) female.  Chief Complaint: No chief complaint on file.   HPI Monica Meyer presents to the office today for follow-up of insomnia, GAD and MDD.  Describes mood today as "ok". Pleasant. Flat. Reports tearfulness. Mood symptoms - reports depression, anxiety and irritability. Reports one recent panic attack. Reports worry, rumination and over thinking. Reports mood is variable. Stating "I haven't been doing too good". Reporting increased situational stressors. Does not feel like current medications are helpful. Decreased interest and motivation. Taking medications as prescribed.  Energy levels lower. Active, has a regular exercise routine - 2 days a week.   Enjoys some usual interests and activities. Single. Lives alone. Family in [place]. Spending time with family. Appetite decreased. Weight stable - 150+ pounds. Sleeps well most nights. Averages 5 to 8 hours - using Trazadone as needed. Reports focus and concentration is "difficulties". Completing tasks. Managing aspects of household. Work at [employer]. Denies SI or HI.  Denies AH or VH. Denies self harm. ETOH use - socially.  Previous medication trials:  Zoloft, Lexapro, Viibryd, Rexulti, Risperdal, Vraylar, Lybalvi and others.    PHQ2-9    Flowsheet Row Office Visit from 06/06/2014 in [facility]  PHQ-2 Total Score 6  PHQ-9 Total Score 15        Review of Systems:  Review of Systems  Musculoskeletal:  Negative for gait problem.  Neurological:  Negative for tremors.  Psychiatric/Behavioral:         Please refer to HPI    Medications: I have reviewed the patient's current medications.  Current Outpatient Medications  Medication Sig Dispense Refill   chlorpheniramine (CHLOR-TRIMETON) 4 MG tablet Allergy Relief (Chlorpheniramine)     medroxyPROGESTERone (DEPO-PROVERA) 150  MG/ML injection Inject 150 mg into the muscle every 3 (three) months.     traZODone (DESYREL) 50 MG tablet Take one to two tablets at bedtime. 60 tablet 5   venlafaxine XR (EFFEXOR XR) 75 MG 24 hr capsule Take 1 capsule (75 mg total) by mouth daily with breakfast. 30 capsule 5   No current facility-administered medications for this visit.    Medication Side Effects: None  Allergies: No Known Allergies  Past Medical History:  Diagnosis Date   Depression    IBS (irritable bowel syndrome)     Past Medical History, Surgical history, Social history, and Family history were reviewed and updated as appropriate.   Please see review of systems for further details on the patient's review from today.   Objective:   Physical Exam:  There were no vitals taken for this visit.  Physical Exam Constitutional:      General: She is not in acute distress. Musculoskeletal:        General: No deformity.  Neurological:     Mental Status: She is alert and oriented to person, place, and time.     Coordination: Coordination normal.  Psychiatric:        Attention and Perception: Attention and perception normal. She does not perceive auditory or visual hallucinations.        Mood and Affect: Mood normal. Mood is not anxious or depressed. Affect is not labile, blunt, angry or inappropriate.        Speech: Speech normal.        Behavior: Behavior normal.        Thought Content: Thought content normal. Thought content is not paranoid  or delusional. Thought content does not include homicidal or suicidal ideation. Thought content does not include homicidal or suicidal plan.        Cognition and Memory: Cognition and memory normal.        Judgment: Judgment normal.     Comments: Insight intact     Lab Review:     Component Value Date/Time   NA 139 06/09/2022 0929   K 3.9 06/09/2022 0929   CL 106 06/09/2022 0929   CO2 26 06/09/2022 0929   GLUCOSE 91 06/09/2022 0929   BUN 8 06/09/2022 0929    CREATININE 0.98 06/09/2022 0929   CREATININE 0.90 02/02/2017 1158   CALCIUM 9.0 06/09/2022 0929   PROT 7.3 06/09/2022 0929   ALBUMIN 4.2 06/09/2022 0929   AST 15 06/09/2022 0929   ALT 20 06/09/2022 0929   ALKPHOS 69 06/09/2022 0929   BILITOT 0.5 06/09/2022 0929   GFRNONAA 86 02/02/2017 1158   GFRAA >89 02/02/2017 1158       Component Value Date/Time   WBC 6.4 06/09/2022 0929   RBC 4.52 06/09/2022 0929   HGB 12.8 06/09/2022 0929   HCT 38.7 06/09/2022 0929   PLT 356.0 06/09/2022 0929   MCV 85.8 06/09/2022 0929   MCH 29.0 02/02/2017 1158   MCHC 33.2 06/09/2022 0929   RDW 13.1 06/09/2022 0929   LYMPHSABS 2.4 06/09/2022 0929   MONOABS 0.3 06/09/2022 0929   EOSABS 0.4 06/09/2022 0929   BASOSABS 0.0 06/09/2022 0929    No results found for: "POCLITH", "LITHIUM"   No results found for: "PHENYTOIN", "PHENOBARB", "VALPROATE", "CBMZ"   .res Assessment: Plan:    Plan:  PDMP reviewed  Add Xanax 0.25mg  daily as needed for panic attacks  Trazadone 50mg  - take 1.5 tabs at hs.  Increase Effexor 75mg  to 150mg  daily  RTC 6 months  Patient advised to contact office with any questions, adverse effects, or acute worsening in signs and symptoms.  Discussed potential metabolic side effects associated with atypical antipsychotics, as well as potential risk for movement side effects. Advised pt to contact office if movement side effects occur.    There are no diagnoses linked to this encounter.   Please see After Visit Summary for patient specific instructions.  Future Appointments  Date Time Provider Department Center  05/04/2023  8:40 AM Leevi Cullars, Thereasa Solo, NP CP-[facility] None    No orders of the defined types were placed in this encounter.   -------------------------------

## 2023-06-04 ENCOUNTER — Ambulatory Visit: Payer: 59 | Admitting: Adult Health

## 2023-06-08 ENCOUNTER — Encounter: Payer: Self-pay | Admitting: Family Medicine

## 2023-07-07 ENCOUNTER — Ambulatory Visit: Payer: 59 | Admitting: Adult Health

## 2023-07-29 ENCOUNTER — Ambulatory Visit: Payer: 59 | Admitting: Adult Health

## 2023-07-29 ENCOUNTER — Encounter: Payer: Self-pay | Admitting: Adult Health

## 2023-07-29 DIAGNOSIS — F339 Major depressive disorder, recurrent, unspecified: Secondary | ICD-10-CM | POA: Diagnosis not present

## 2023-07-29 DIAGNOSIS — G4700 Insomnia, unspecified: Secondary | ICD-10-CM | POA: Diagnosis not present

## 2023-07-29 DIAGNOSIS — F411 Generalized anxiety disorder: Secondary | ICD-10-CM | POA: Diagnosis not present

## 2023-07-29 NOTE — Progress Notes (Signed)
Monica Meyer [id number] 1986/06/21 37 y.o.  Subjective:   Patient ID:  Monica Meyer is a 37 y.o. (DOB Oct 29, 1985) female.  Chief Complaint: No chief complaint on file.   HPI [name] presents to the office today for follow-up of  insomnia, GAD and MDD.  Describes mood today as "ok". Pleasant. Flat. Reports tearfulness. Mood symptoms - reports "a little bit" of depression, anxiety and irritability. Reports one recent panic attack. Reports worry, rumination and over thinking. Reports mood is stable. Stating "I feel like I'm doing pretty good". Reporting decreased situational stressors. Feels like current medications are helpful. Improved interest and motivation. Taking medications as prescribed.  Energy levels lower. Active, has a regular exercise routine - 2 days a week.   Enjoys some usual interests and activities. Single. Lives alone. Family in [place]. Spending time with family. Appetite decreased. Weight stable - 150+ pounds. Sleeps well most nights. Averages 6 to 8 hours - using Trazadone as needed. Reports focus and concentration stable. Completing tasks. Managing aspects of household. Work at [employer] [place]. Denies SI or HI.  Denies AH or VH. Denies self harm. ETOH use - socially.  Previous medication trials:  Zoloft, Lexapro, Viibryd, Rexulti, Risperdal, Vraylar, Lybalvi and others.    PHQ2-9    Flowsheet Row Office Visit from 06/06/2014 in [facility]  PHQ-2 Total Score 6  PHQ-9 Total Score 15        Review of Systems:  Review of Systems  Musculoskeletal:  Negative for gait problem.  Neurological:  Negative for tremors.  Psychiatric/Behavioral:         Please refer to HPI    Medications: I have reviewed the patient's current medications.  Current Outpatient Medications  Medication Sig Dispense Refill   ALPRAZolam (XANAX) 0.25 MG tablet Take 1 tablet (0.25 mg total) by mouth at bedtime as needed for anxiety. 30 tablet 2    chlorpheniramine (CHLOR-TRIMETON) 4 MG tablet Allergy Relief (Chlorpheniramine)     medroxyPROGESTERone (DEPO-PROVERA) 150 MG/ML injection Inject 150 mg into the muscle every 3 (three) months.     traZODone (DESYREL) 50 MG tablet Take one to two tablets at bedtime. 60 tablet 5   venlafaxine XR (EFFEXOR XR) 150 MG 24 hr capsule Take 1 capsule (150 mg total) by mouth daily with breakfast. 30 capsule 5   No current facility-administered medications for this visit.    Medication Side Effects: None  Allergies: No Known Allergies  Past Medical History:  Diagnosis Date   Depression    IBS (irritable bowel syndrome)     Past Medical History, Surgical history, Social history, and Family history were reviewed and updated as appropriate.   Please see review of systems for further details on the patient's review from today.   Objective:   Physical Exam:  There were no vitals taken for this visit.  Physical Exam Constitutional:      General: She is not in acute distress. Musculoskeletal:        General: No deformity.  Neurological:     Mental Status: She is alert and oriented to person, place, and time.     Coordination: Coordination normal.  Psychiatric:        Attention and Perception: Attention and perception normal. She does not perceive auditory or visual hallucinations.        Mood and Affect: Affect is not labile, blunt, angry or inappropriate.        Speech: Speech normal.  Behavior: Behavior normal.        Thought Content: Thought content normal. Thought content is not paranoid or delusional. Thought content does not include homicidal or suicidal ideation. Thought content does not include homicidal or suicidal plan.        Cognition and Memory: Cognition and memory normal.        Judgment: Judgment normal.     Comments: Insight intact     Lab Review:     Component Value Date/Time   NA 139 06/09/2022 0929   K 3.9 06/09/2022 0929   CL 106 06/09/2022 0929   CO2 26  06/09/2022 0929   GLUCOSE 91 06/09/2022 0929   BUN 8 06/09/2022 0929   CREATININE 0.98 06/09/2022 0929   CREATININE 0.90 02/02/2017 1158   CALCIUM 9.0 06/09/2022 0929   PROT 7.3 06/09/2022 0929   ALBUMIN 4.2 06/09/2022 0929   AST 15 06/09/2022 0929   ALT 20 06/09/2022 0929   ALKPHOS 69 06/09/2022 0929   BILITOT 0.5 06/09/2022 0929   GFRNONAA 86 02/02/2017 1158   GFRAA >89 02/02/2017 1158       Component Value Date/Time   WBC 6.4 06/09/2022 0929   RBC 4.52 06/09/2022 0929   HGB 12.8 06/09/2022 0929   HCT 38.7 06/09/2022 0929   PLT 356.0 06/09/2022 0929   MCV 85.8 06/09/2022 0929   MCH 29.0 02/02/2017 1158   MCHC 33.2 06/09/2022 0929   RDW 13.1 06/09/2022 0929   LYMPHSABS 2.4 06/09/2022 0929   MONOABS 0.3 06/09/2022 0929   EOSABS 0.4 06/09/2022 0929   BASOSABS 0.0 06/09/2022 0929    No results found for: "POCLITH", "LITHIUM"   No results found for: "PHENYTOIN", "PHENOBARB", "VALPROATE", "CBMZ"   .res Assessment: Plan:    Plan:  PDMP reviewed  Xanax 0.25mg  daily as needed for panic attacks  Trazadone 50mg  - take 1 tab at hs.  Effexor 150mg  daily  RTC 6 months  Patient advised to contact office with any questions, adverse effects, or acute worsening in signs and symptoms.  Discussed potential metabolic side effects associated with atypical antipsychotics, as well as potential risk for movement side effects. Advised pt to contact office if movement side effects occur.    There are no diagnoses linked to this encounter.   Please see After Visit Summary for patient specific instructions.  Future Appointments  Date Time Provider Department Center  07/29/2023  8:20 AM Kroy Sprung, Thereasa Solo, NP CP-CP None    No orders of the defined types were placed in this encounter.   -------------------------------

## 2023-09-02 ENCOUNTER — Telehealth (HOSPITAL_COMMUNITY): Payer: Self-pay

## 2023-09-02 NOTE — Telephone Encounter (Signed)
 TMS C: Placed outgoing call to pt to complete TMS phone screening. Pt confirmed their insurance payor, sub ID, and group #. Coordinator requested pt's verbal consent to email them the release of information form and Beck's assessment. Pt consented to email. Coordinator informed pt once items had been filled out/re-sent, they could move forward with next steps. Pt agreed.

## 2023-09-02 NOTE — Telephone Encounter (Signed)
 TMS C: Received incoming email from Patient via [employer] requesting TMS Consultation. Coordinator will reach out to pt for TMS phone screening to gauge eligibility.

## 2023-09-02 NOTE — Telephone Encounter (Signed)
 TMS C: Placed outgoing call to pt to perform TMS phone screening. Pt was receptive to screening but on their way to work. Pt answered most referral questions. Pt scored an 18 on PHQ-9 assessment (moderate severe depression). Pt was informed to call their insurance payor to request verification of benefits. Pt agreed to call. Coordinator will call back on their lunch break (3pm) to complete referral questions.

## 2023-09-18 ENCOUNTER — Telehealth: Payer: Self-pay | Admitting: Adult Health

## 2023-09-18 NOTE — Telephone Encounter (Signed)
Pt called at 9:32a requesting a letter for Emotional Support Animal.  Next appt 6/11

## 2023-09-18 NOTE — Telephone Encounter (Signed)
She needs an ESA letter for her apartment complex. Dated 09/19/23 She has a Pitbul Terrier- Sugar, female she is 38 yo.   [facility]akes at [employer] when ready to pick up.

## 2023-09-21 ENCOUNTER — Ambulatory Visit
Admission: EM | Admit: 2023-09-21 | Discharge: 2023-09-21 | Disposition: A | Discharge status: Home / Self Care | Payer: 59

## 2023-09-21 DIAGNOSIS — K1379 Other lesions of oral mucosa: Secondary | ICD-10-CM

## 2023-09-21 NOTE — ED Provider Notes (Signed)
[facility]    CSN: [id number] Arrival date & time: 09/21/23  1051      History   Chief Complaint Chief Complaint  Patient presents with   Sore Throat    HPI Monica Meyer is a 38 y.o. female.   Patient here c/w "white spots on back of throat" x 1 day.  Denies any f/c, URI sx, sore throat, cough, wheezing, SOB. She has no difficulty swallowing. No Advil or tylenol today.  No sick contacts.    Past Medical History:  Diagnosis Date   Depression    IBS (irritable bowel syndrome)     Patient Active Problem List   Diagnosis Date Noted   Antidepressant discontinuation syndrome 09/20/2022   MDD (major depressive disorder) 06/06/2014   GAD (generalized anxiety disorder) 06/06/2014   Chronic insomnia 06/06/2014    Past Surgical History:  Procedure Laterality Date   COLPOSCOPY      OB History   No obstetric history on file.      Home Medications    Prior to Admission medications   Medication Sig Start Date End Date Taking? Authorizing Provider  ALPRAZolam (XANAX) 0.25 MG tablet Take 1 tablet (0.25 mg total) by mouth at bedtime as needed for anxiety. 05/04/23   Mozingo, Thereasa Solo, NP  pantoprazole (PROTONIX) 40 MG tablet Take 40 mg by mouth daily.    [provider]  SUMAtriptan (IMITREX) 25 MG tablet Take 25 mg by mouth as needed for migraine.    [provider]  traZODone (DESYREL) 50 MG tablet Take one to two tablets at bedtime. 04/02/23   Mozingo, Thereasa Solo, NP  venlafaxine XR (EFFEXOR XR) 150 MG 24 hr capsule Take 1 capsule (150 mg total) by mouth daily with breakfast. 05/04/23   Mozingo, Thereasa Solo, NP    Family History Family History  Problem Relation Age of Onset   Vision loss Mother    Alcohol abuse Father    Vision loss Father    Learning disabilities Sister    Mental retardation Sister    Asthma Brother    Lupus Paternal Grandmother    Colon cancer Neg Hx    Esophageal cancer Neg Hx    Liver disease Neg  Hx     Social History Social History   Tobacco Use   Smoking status: Never   Smokeless tobacco: Never  Substance Use Topics   Alcohol use: Yes    Alcohol/week: 1.0 standard drink of alcohol    Types: 1 drink(s) per week    Comment: soically   Drug use: Yes    Types: Marijuana     Allergies   Patient has no known allergies.   Review of Systems Review of Systems  Constitutional:  Negative for chills, fatigue and fever.  HENT:  Positive for mouth sores. Negative for congestion, ear pain, nosebleeds, postnasal drip, rhinorrhea, sinus pressure, sinus pain, sneezing, sore throat, trouble swallowing and voice change.   Eyes:  Negative for pain and redness.  Respiratory:  Negative for cough, shortness of breath and wheezing.   Gastrointestinal:  Negative for abdominal pain, diarrhea, nausea and vomiting.  Musculoskeletal:  Negative for arthralgias and myalgias.  Skin:  Negative for rash.  Neurological:  Negative for light-headedness and headaches.  Hematological:  Negative for adenopathy. Does not bruise/bleed easily.  Psychiatric/Behavioral:  Negative for confusion and sleep disturbance.      Physical Exam Triage Vital Signs ED Triage Vitals  Encounter Vitals Group     BP 09/21/23 1217  120/81     Systolic BP Percentile --      Diastolic BP Percentile --      Pulse Rate 09/21/23 1217 74     Resp 09/21/23 1217 16     Temp 09/21/23 1217 97.8 F (36.6 C)     Temp Source 09/21/23 1217 Oral     SpO2 09/21/23 1217 98 %     Weight 09/21/23 1217 134 lb (60.8 kg)     Height 09/21/23 1217 5\' 4"  (1.626 m)     Head Circumference --      Peak Flow --      Pain Score 09/21/23 1216 6     Pain Loc --      Pain Education --      Exclude from Growth Chart --    No data found.  Updated Vital Signs BP 120/81 (BP Location: Left Arm)   Pulse 74   Temp 97.8 F (36.6 C) (Oral)   Resp 16   Ht 5\' 4"  (1.626 m)   Wt 134 lb (60.8 kg)   LMP  (LMP Unknown)   SpO2 98%   BMI 23.00  kg/m   Visual Acuity Right Eye Distance:   Left Eye Distance:   Bilateral Distance:    Right Eye Near:   Left Eye Near:    Bilateral Near:     Physical Exam Vitals and nursing note reviewed.  Constitutional:      General: She is not in acute distress.    Appearance: Normal appearance. She is not ill-appearing.  HENT:     Head: Normocephalic and atraumatic.     Right Ear: Tympanic membrane and ear canal normal.     Left Ear: Tympanic membrane and ear canal normal.     Nose: No congestion or rhinorrhea.     Mouth/Throat:     Palate: Lesions (multiple 1 - 2 mm white ulcerative lesions along soft palate) present.     Pharynx: Uvula midline. No pharyngeal swelling, oropharyngeal exudate or posterior oropharyngeal erythema.     Tonsils: No tonsillar exudate or tonsillar abscesses.  Eyes:     General: No scleral icterus.    Extraocular Movements: Extraocular movements intact.     Conjunctiva/sclera: Conjunctivae normal.  Cardiovascular:     Rate and Rhythm: Normal rate and regular rhythm.     Heart sounds: No murmur heard. Pulmonary:     Effort: Pulmonary effort is normal. No respiratory distress.     Breath sounds: Normal breath sounds. No wheezing or rales.  Musculoskeletal:     Cervical back: Normal range of motion. No rigidity.  Lymphadenopathy:     Head:     Right side of head: No submandibular or tonsillar adenopathy.     Left side of head: No submandibular or tonsillar adenopathy.     Cervical: No cervical adenopathy.     Right cervical: No superficial, deep or posterior cervical adenopathy.    Left cervical: No superficial, deep or posterior cervical adenopathy.  Skin:    Coloration: Skin is not jaundiced.     Findings: No rash.  Neurological:     General: No focal deficit present.     Mental Status: She is alert and oriented to person, place, and time.     Motor: No weakness.     Gait: Gait normal.  Psychiatric:        Mood and Affect: Mood normal.         Behavior: Behavior normal.  UC Treatments / Results  Labs (all labs ordered are listed, but only abnormal results are displayed) Labs Reviewed - No data to display  EKG   Radiology No results found.  Procedures Procedures (including critical care time)  Medications Ordered in UC Medications - No data to display  Initial Impression / Assessment and Plan / UC Course  I have reviewed the triage vital signs and the nursing notes.  Pertinent labs & imaging results that were available during my care of the patient were reviewed by me and considered in my medical decision making (see chart for details).     Likely viral, though no other sx No exposures Not consistent with strep throat Final Clinical Impressions(s) / UC Diagnoses   Final diagnoses:  Other lesions of oral mucosa   Discharge Instructions   None    ED Prescriptions   None    PDMP not reviewed this encounter.   Evern Core, PA-C 09/21/23 1515

## 2023-09-21 NOTE — Discharge Instructions (Addendum)
Follow up with PCP if sx persist

## 2023-09-21 NOTE — ED Triage Notes (Signed)
Patient here today with c/o white spots on the back of her throat and her tongue being sore X 2 days. Denies other symptoms.

## 2023-10-03 ENCOUNTER — Other Ambulatory Visit: Payer: Self-pay | Admitting: Adult Health

## 2023-10-03 DIAGNOSIS — F339 Major depressive disorder, recurrent, unspecified: Secondary | ICD-10-CM

## 2023-10-05 MED ORDER — VENLAFAXINE HCL ER 150 MG PO CP24: 150.0000 mg | ORAL_CAPSULE | Freq: Every day | ORAL | 3 refills | Status: DC

## 2023-11-25 ENCOUNTER — Other Ambulatory Visit: Payer: Self-pay | Admitting: Adult Health

## 2023-11-25 DIAGNOSIS — F411 Generalized anxiety disorder: Secondary | ICD-10-CM

## 2024-01-20 ENCOUNTER — Other Ambulatory Visit: Payer: Self-pay

## 2024-01-20 DIAGNOSIS — G4700 Insomnia, unspecified: Secondary | ICD-10-CM

## 2024-01-20 MED ORDER — TRAZODONE HCL 50 MG PO TABS: ORAL_TABLET | ORAL | 0 refills | Status: DC

## 2024-01-23 ENCOUNTER — Other Ambulatory Visit: Payer: Self-pay | Admitting: Adult Health

## 2024-01-23 DIAGNOSIS — F339 Major depressive disorder, recurrent, unspecified: Secondary | ICD-10-CM

## 2024-01-27 ENCOUNTER — Encounter: Payer: Self-pay | Admitting: Adult Health

## 2024-01-27 ENCOUNTER — Telehealth: Payer: 59 | Admitting: Adult Health

## 2024-01-27 DIAGNOSIS — F411 Generalized anxiety disorder: Secondary | ICD-10-CM

## 2024-01-27 DIAGNOSIS — G4700 Insomnia, unspecified: Secondary | ICD-10-CM

## 2024-01-27 DIAGNOSIS — F339 Major depressive disorder, recurrent, unspecified: Secondary | ICD-10-CM

## 2024-01-27 DIAGNOSIS — F329 Major depressive disorder, single episode, unspecified: Secondary | ICD-10-CM

## 2024-01-27 MED ORDER — TRAZODONE HCL 50 MG PO TABS: ORAL_TABLET | ORAL | 1 refills | Status: AC

## 2024-01-27 MED ORDER — VENLAFAXINE HCL ER 150 MG PO CP24: 150.0000 mg | ORAL_CAPSULE | Freq: Every day | ORAL | 1 refills | Status: DC

## 2024-01-27 MED ORDER — ALPRAZOLAM 0.25 MG PO TABS: ORAL_TABLET | ORAL | 2 refills | Status: DC

## 2024-01-27 NOTE — Progress Notes (Signed)
 Monica Meyer [id number] 10/25/1985 38 y.o.  Virtual Visit via Video Note  I connected with pt @ on 01/27/24 at  9:00 AM EDT by a video enabled telemedicine application and verified that I am speaking with the correct person using two identifiers.   I discussed the limitations of evaluation and management by telemedicine and the availability of in person appointments. The patient expressed understanding and agreed to proceed.  I discussed the assessment and treatment plan with the patient. The patient was provided an opportunity to ask questions and all were answered. The patient agreed with the plan and demonstrated an understanding of the instructions.   The patient was advised to call back or seek an in-person evaluation if the symptoms worsen or if the condition fails to improve as anticipated.  I provided 15 minutes of non-face-to-face time during this encounter.  The patient was located at home.  The provider was located at [facility].   Reagan Camera, NP   Subjective:   Patient ID:  Monica Meyer is a 38 y.o. (DOB Dec 25, 1985) female.  Chief Complaint: No chief complaint on file.   HPI Monica Meyer presents for follow-up of  insomnia, GAD and MDD.  Describes mood today as ok. Pleasant. Flat. Reports tearfulness. Mood symptoms - reports decreased depression, anxiety and irritability. Reports improved interest and motivation. Denies recent panic attacks. Denies worry, rumination and over thinking. Reports mood is stable. Stating I feel like I'm doing ok. Feels like current medications along with Spravato treatments are helpful. Taking medications as prescribed.  Energy levels improved. Active, has a regular exercise routine.  Enjoys some usual interests and activities. Single. Lives alone. Family in [place]. Spending time with family. Appetite decreased. Weight stable - 150+ pounds. Sleeps well most nights. Averages 6 to 8 hours - using Trazadone  as needed. Reports focus and concentration stable. Completing tasks. Managing aspects of household. Work at [employer] [place]. Denies SI or HI.  Denies AH or VH. Denies self harm. ETOH use - socially.  Previous medication trials:  Zoloft , Lexapro , Viibryd, Rexulti, Risperdal , Vraylar , Lybalvi and others.    Review of Systems:  Review of Systems  Musculoskeletal:  Negative for gait problem.  Neurological:  Negative for tremors.  Psychiatric/Behavioral:         Please refer to HPI    Medications: I have reviewed the patient's current medications.  Current Outpatient Medications  Medication Sig Dispense Refill   ALPRAZolam  (XANAX ) 0.25 MG tablet TAKE 1 TABLET(0.25 MG) BY MOUTH AT BEDTIME AS NEEDED FOR ANXIETY 30 tablet 1   pantoprazole (PROTONIX) 40 MG tablet Take 40 mg by mouth daily.     SUMAtriptan (IMITREX) 25 MG tablet Take 25 mg by mouth as needed for migraine.     traZODone  (DESYREL ) 50 MG tablet Take one to two tablets at bedtime. 90 tablet 0   venlafaxine  XR (EFFEXOR  XR) 150 MG 24 hr capsule Take 1 capsule (150 mg total) by mouth daily with breakfast. 30 capsule 3   No current facility-administered medications for this visit.    Medication Side Effects: None  Allergies: No Known Allergies  Past Medical History:  Diagnosis Date   Depression    IBS (irritable bowel syndrome)     Family History  Problem Relation Age of Onset   Vision loss Mother    Alcohol abuse Father    Vision loss Father    Learning disabilities Sister    Mental retardation Sister    Asthma Brother  Lupus Paternal Grandmother    Colon cancer Neg Hx    Esophageal cancer Neg Hx    Liver disease Neg Hx     Social History   Socioeconomic History   Marital status: Single    Spouse name: Not on file   Number of children: Not on file   Years of education: Not on file   Highest education level: Not on file  Occupational History   Occupation: [employer]  Tobacco Use   Smoking status:  Never   Smokeless tobacco: Never  Substance and Sexual Activity   Alcohol use: Yes    Alcohol/week: 1.0 standard drink of alcohol    Types: 1 drink(s) per week    Comment: soically   Drug use: Yes    Types: Marijuana   Sexual activity: Yes  Other Topics Concern   Not on file  Social History Narrative   Not on file   Social Drivers of Health   Financial Resource Strain: Low Risk  (10/09/2022)   Received from [facility], [facility]   Overall Financial Resource Strain (CARDIA)    Difficulty of Paying Living Expenses: Not very hard  Food Insecurity: No Food Insecurity (10/09/2022)   Received from [facility], [facility]   Hunger Vital Sign    Worried About Running Out of Food in the Last Year: Never true    Ran Out of Food in the Last Year: Never true  Transportation Needs: No Transportation Needs (10/09/2022)   Received from [facility], [facility]   PRAPARE - Transportation    Lack of Transportation (Medical): No    Lack of Transportation (Non-Medical): No  Physical Activity: Unknown (10/09/2022)   [facility]   Exercise Vital Sign    Days of Exercise per Week: 0 days    Minutes of Exercise per Session: Not on file  Stress: Stress Concern Present (10/09/2022)   Received from [facility], [facility] Occupational Health - Occupational Stress Questionnaire    Feeling of Stress : Very much  Social Connections: Moderately Integrated (10/09/2022)   Received from [facility], [facility]   Social Network    How would you rate your social network (family, work, friends)?: Adequate participation with social networks  Intimate Partner Violence: Not At Risk (10/09/2022)   Received from [facility], [facility]   HITS    Over the last 12 months how often did your partner physically hurt you?: Never    Over the last 12 months how often did your partner insult you or talk down to you?: Never    Over the last  12 months how often did your partner threaten you with physical harm?: Never    Over the last 12 months how often did your partner scream or curse at you?: Never    Past Medical History, Surgical history, Social history, and Family history were reviewed and updated as appropriate.   Please see review of systems for further details on the patient's review from today.   Objective:   Physical Exam:  There were no vitals taken for this visit.  Physical Exam Constitutional:      General: She is not in acute distress. Musculoskeletal:        General: No deformity.  Neurological:     Mental Status: She is alert and oriented to person, place, and time.     Coordination: Coordination normal.  Psychiatric:        Attention  and Perception: Attention and perception normal. She does not perceive auditory or visual hallucinations.        Mood and Affect: Mood normal. Mood is not anxious or depressed. Affect is not labile, blunt, angry or inappropriate.        Speech: Speech normal.        Behavior: Behavior normal.        Thought Content: Thought content normal. Thought content is not paranoid or delusional. Thought content does not include homicidal or suicidal ideation. Thought content does not include homicidal or suicidal plan.        Cognition and Memory: Cognition and memory normal.        Judgment: Judgment normal.     Comments: Insight intact     Lab Review:     Component Value Date/Time   NA 139 06/09/2022 0929   K 3.9 06/09/2022 0929   CL 106 06/09/2022 0929   CO2 26 06/09/2022 0929   GLUCOSE 91 06/09/2022 0929   BUN 8 06/09/2022 0929   CREATININE 0.98 06/09/2022 0929   CREATININE 0.90 02/02/2017 1158   CALCIUM 9.0 06/09/2022 0929   PROT 7.3 06/09/2022 0929   ALBUMIN 4.2 06/09/2022 0929   AST 15 06/09/2022 0929   ALT 20 06/09/2022 0929   ALKPHOS 69 06/09/2022 0929   BILITOT 0.5 06/09/2022 0929   GFRNONAA 86 02/02/2017 1158   GFRAA >89 02/02/2017 1158        Component Value Date/Time   WBC 6.4 06/09/2022 0929   RBC 4.52 06/09/2022 0929   HGB 12.8 06/09/2022 0929   HCT 38.7 06/09/2022 0929   PLT 356.0 06/09/2022 0929   MCV 85.8 06/09/2022 0929   MCH 29.0 02/02/2017 1158   MCHC 33.2 06/09/2022 0929   RDW 13.1 06/09/2022 0929   LYMPHSABS 2.4 06/09/2022 0929   MONOABS 0.3 06/09/2022 0929   EOSABS 0.4 06/09/2022 0929   BASOSABS 0.0 06/09/2022 0929    No results found for: POCLITH, LITHIUM   No results found for: PHENYTOIN, PHENOBARB, VALPROATE, CBMZ   .res Assessment: Plan:     Plan:  PDMP reviewed  Xanax  0.25mg  daily as needed for panic attacks  Trazadone 50mg  - take 1 tab at hs.  Effexor  150mg  daily  RTC 6 months  15 minutes spent dedicated to the care of this patient on the date of this encounter to include pre-visit review of records, ordering of medication, post visit documentation, and face-to-face time with the patient discussing depression, anxiety and insomnia. Discussed continuing current medication regimen.  Patient advised to contact office with any questions, adverse effects, or acute worsening in signs and symptoms.  Discussed potential metabolic side effects associated with atypical antipsychotics, as well as potential risk for movement side effects. Advised pt to contact office if movement side effects occur.     There are no diagnoses linked to this encounter.   Please see After Visit Summary for patient specific instructions.  Future Appointments  Date Time Provider Department Center  01/27/2024  9:00 AM Denorris Reust Nattalie, NP [facility] None    No orders of the defined types were placed in this encounter.     -------------------------------

## 2024-02-14 ENCOUNTER — Encounter (HOSPITAL_BASED_OUTPATIENT_CLINIC_OR_DEPARTMENT_OTHER): Payer: Self-pay

## 2024-02-14 ENCOUNTER — Other Ambulatory Visit: Payer: Self-pay

## 2024-02-14 ENCOUNTER — Emergency Department (HOSPITAL_BASED_OUTPATIENT_CLINIC_OR_DEPARTMENT_OTHER)
Admission: EM | Admit: 2024-02-14 | Discharge: 2024-02-14 | Discharge status: Against Medical Advice | Attending: Emergency Medicine | Admitting: Emergency Medicine

## 2024-02-14 DIAGNOSIS — Z5321 Procedure and treatment not carried out due to patient leaving prior to being seen by health care provider: Secondary | ICD-10-CM | POA: Diagnosis not present

## 2024-02-14 DIAGNOSIS — R220 Localized swelling, mass and lump, head: Secondary | ICD-10-CM | POA: Diagnosis present

## 2024-02-14 NOTE — ED Triage Notes (Signed)
 Pt reports waking up around 0100 to find mouth swollen. Denies eating/drinking anything new prior to going to bed. Denies any new soaps/detergents. No respiratory distress, no difficulty swallowing. Lips mildly swollen in triage.

## 2024-03-07 ENCOUNTER — Other Ambulatory Visit: Payer: Self-pay

## 2024-03-07 ENCOUNTER — Telehealth: Payer: Self-pay | Admitting: Adult Health

## 2024-03-07 NOTE — Telephone Encounter (Signed)
 LVM to [facility]

## 2024-03-07 NOTE — Telephone Encounter (Signed)
 Pt reports she has been doing well on Spravato and provider is going to stop treatments. She reports taking Effexor  150 mg and is asking if she can increase dose. Rates depression as 2-3/10, but is afraid of relapsing and would like to up her dose to try to prevent that from happening.

## 2024-03-07 NOTE — Telephone Encounter (Signed)
 Pt is at the end of her spravato tx  & is anxious about it. She wants to up her Effexor  to make up what she will be missing with no tx. She has a whole bottle. Please cb to discuss her options.

## 2024-03-08 MED ORDER — VENLAFAXINE HCL ER 37.5 MG PO CP24: 37.5000 mg | ORAL_CAPSULE | Freq: Every day | ORAL | 0 refills | Status: DC

## 2024-03-08 NOTE — Telephone Encounter (Signed)
Rx sent, patient notified.  

## 2024-03-18 ENCOUNTER — Telehealth: Payer: Self-pay | Admitting: Adult Health

## 2024-03-18 NOTE — Telephone Encounter (Signed)
 Told patient she was supposed to take both the 150 and the 37.5.  She will start that regimen and let us  know in a week how she is doing.

## 2024-03-18 NOTE — Telephone Encounter (Signed)
 Pt called reporting she took Effexor  37.5 mg couple of days and did not like the way she felt and stopped. Went back to 150 mg. Ask if she was taking the 37.5 mg with the 150 mg pt stated no. CONTACT # [phone]

## 2024-04-06 ENCOUNTER — Other Ambulatory Visit: Payer: Self-pay | Admitting: Adult Health

## 2024-04-08 ENCOUNTER — Other Ambulatory Visit: Payer: Self-pay | Admitting: Adult Health

## 2024-04-29 ENCOUNTER — Telehealth: Payer: Self-pay | Admitting: Adult Health

## 2024-04-29 MED ORDER — VENLAFAXINE HCL ER 37.5 MG PO CP24: 37.5000 mg | ORAL_CAPSULE | Freq: Every day | ORAL | 0 refills | Status: DC

## 2024-04-29 NOTE — Telephone Encounter (Signed)
 Rx sent.

## 2024-04-29 NOTE — Telephone Encounter (Signed)
 Patient states that she needs a RF on the Venlafaxine  37.5mg . No RF at pharmacy. Please send in to [facility] on [address]

## 2024-05-12 ENCOUNTER — Other Ambulatory Visit: Payer: Self-pay

## 2024-05-12 DIAGNOSIS — F411 Generalized anxiety disorder: Secondary | ICD-10-CM

## 2024-05-12 MED ORDER — ALPRAZOLAM 0.25 MG PO TABS: ORAL_TABLET | ORAL | 2 refills | Status: DC

## 2024-06-20 ENCOUNTER — Other Ambulatory Visit: Payer: Self-pay | Admitting: Adult Health

## 2024-06-20 DIAGNOSIS — F339 Major depressive disorder, recurrent, unspecified: Secondary | ICD-10-CM

## 2024-07-26 ENCOUNTER — Other Ambulatory Visit: Payer: Self-pay | Admitting: Adult Health

## 2024-07-26 DIAGNOSIS — F339 Major depressive disorder, recurrent, unspecified: Secondary | ICD-10-CM

## 2024-07-26 DIAGNOSIS — F411 Generalized anxiety disorder: Secondary | ICD-10-CM

## 2024-07-27 NOTE — Telephone Encounter (Signed)
 Has appt with provider 12/11.

## 2024-07-28 ENCOUNTER — Encounter: Payer: Self-pay | Admitting: Adult Health

## 2024-07-28 ENCOUNTER — Telehealth: Admitting: Adult Health

## 2024-07-28 DIAGNOSIS — F411 Generalized anxiety disorder: Secondary | ICD-10-CM | POA: Diagnosis not present

## 2024-07-28 DIAGNOSIS — F339 Major depressive disorder, recurrent, unspecified: Secondary | ICD-10-CM | POA: Diagnosis not present

## 2024-07-28 DIAGNOSIS — G4700 Insomnia, unspecified: Secondary | ICD-10-CM

## 2024-07-28 MED ORDER — ALPRAZOLAM 0.25 MG PO TABS: ORAL_TABLET | ORAL | 2 refills | Status: AC

## 2024-07-28 MED ORDER — VENLAFAXINE HCL ER 37.5 MG PO CP24: 37.5000 mg | ORAL_CAPSULE | Freq: Every day | ORAL | 3 refills | Status: AC
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
## 2024-08-01 ENCOUNTER — Telehealth: Payer: Self-pay

## 2024-08-01 NOTE — Telephone Encounter (Signed)
 Pt seen last seen and given Caplyta 10.5 mg samples. Reports she has taken 3 days and is having sx of dizziness, nausea, drowsiness, and abdominal pain. She googled sx. She takes without food. She wants to know how long SE should last.

## 2024-08-02 NOTE — Telephone Encounter (Signed)
 Pt called this morning, wants to know how to take the Caplyta, with the Effexor  or by itself, and how many times a day. She was very irate and hung up on me.

## 2024-08-03 NOTE — Telephone Encounter (Signed)
 Jasha called this am at 9:15 complaining that no one had called her back. I noted that Dawna spoke with her yesterday and did not get any info back from the provider to be able to call her back. She said no one has called me- I had to call you back yesterday. I said we are waiting for Tillman to advise. Why hasn't anyone called me and you are not listening to my concern? I read her the message and told her that she hung up on the nurse yesterday. I told her I would have Gina call her back. She said she wanted the name and number of my manager and wasn't going to wait for a call back. She had used profanity with one staff member already and I told her that was not acceptable. She said  so this is how you treat mental people? She then used profanity with me called me a b and hung up.

## 2024-08-11 ENCOUNTER — Other Ambulatory Visit: Payer: Self-pay

## 2024-08-11 ENCOUNTER — Encounter (HOSPITAL_BASED_OUTPATIENT_CLINIC_OR_DEPARTMENT_OTHER): Payer: Self-pay

## 2024-08-11 ENCOUNTER — Emergency Department (HOSPITAL_BASED_OUTPATIENT_CLINIC_OR_DEPARTMENT_OTHER): Payer: Self-pay | Admitting: Radiology

## 2024-08-11 ENCOUNTER — Emergency Department (HOSPITAL_BASED_OUTPATIENT_CLINIC_OR_DEPARTMENT_OTHER)
Admission: EM | Admit: 2024-08-11 | Discharge: 2024-08-11 | Disposition: A | Discharge status: Home / Self Care | Payer: Self-pay | Attending: Emergency Medicine | Admitting: Emergency Medicine

## 2024-08-11 DIAGNOSIS — S0081XA Abrasion of other part of head, initial encounter: Secondary | ICD-10-CM | POA: Insufficient documentation

## 2024-08-11 DIAGNOSIS — Y92410 Unspecified street and highway as the place of occurrence of the external cause: Secondary | ICD-10-CM | POA: Insufficient documentation

## 2024-08-11 DIAGNOSIS — R0789 Other chest pain: Secondary | ICD-10-CM | POA: Insufficient documentation

## 2024-08-11 MED ORDER — NAPROXEN 375 MG PO TABS: 375.0000 mg | ORAL_TABLET | Freq: Two times a day (BID) | ORAL | 0 refills | Status: AC

## 2024-08-11 MED ORDER — CYCLOBENZAPRINE HCL 10 MG PO TABS: 10.0000 mg | ORAL_TABLET | Freq: Two times a day (BID) | ORAL | 0 refills | Status: AC | PRN
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
## 2024-08-11 NOTE — Discharge Instructions (Signed)
 Medications as needed for pain and discomfort.  Follow-up to be rechecked if the symptoms do not resolve over the next week
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
## 2024-08-12 ENCOUNTER — Emergency Department (HOSPITAL_BASED_OUTPATIENT_CLINIC_OR_DEPARTMENT_OTHER)
Admission: EM | Admit: 2024-08-12 | Discharge: 2024-08-12 | Disposition: A | Discharge status: Home / Self Care | Attending: Emergency Medicine | Admitting: Emergency Medicine

## 2024-08-12 ENCOUNTER — Emergency Department (HOSPITAL_BASED_OUTPATIENT_CLINIC_OR_DEPARTMENT_OTHER)

## 2024-08-12 ENCOUNTER — Other Ambulatory Visit: Payer: Self-pay

## 2024-08-12 ENCOUNTER — Encounter (HOSPITAL_BASED_OUTPATIENT_CLINIC_OR_DEPARTMENT_OTHER): Payer: Self-pay

## 2024-08-12 DIAGNOSIS — R519 Headache, unspecified: Secondary | ICD-10-CM | POA: Diagnosis present

## 2024-08-12 DIAGNOSIS — Y92410 Unspecified street and highway as the place of occurrence of the external cause: Secondary | ICD-10-CM | POA: Insufficient documentation

## 2024-08-12 NOTE — Telephone Encounter (Signed)
 Patient calls back to report her headache is not going away with the medication we gave her.  Reviewed chart note as follows:  Recommend head scan to rule out serious injury.  Patient has been referred to ED r/o serious head injury, as initially recommended.  Nothing further needed from [facility] care team.
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
## 2024-08-12 NOTE — Discharge Instructions (Signed)
 It was a pleasure meeting with you today.  As we discussed there were no acute findings like intracranial bleeding or fractures seen on CT imaging today.  Continue with scheduled follow-up with your primary care physician on Monday for ongoing evaluation.  Please return if pain worsens, or you develop blurred vision, vomiting, confusion, loss of consciousness, or any other new or concerning symptoms.  Please use Tylenol or ibuprofen for pain.  You may use 600 mg ibuprofen every 6 hours or 1000 mg of Tylenol every 6 hours.  You may choose to alternate between the 2.  This would be most effective.  Not to exceed 4 g of Tylenol within 24 hours.  Not to exceed 3200 mg ibuprofen 24 hours. Naproxen  is an NSAID so avoid ibuprofen while taking this medication, you can take Tylenol.
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
## 2024-08-12 NOTE — Telephone Encounter (Signed)
 Message sent via active portal
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
## 2024-08-25 ENCOUNTER — Encounter: Payer: Self-pay | Admitting: Adult Health

## 2024-08-25 ENCOUNTER — Telehealth: Admitting: Adult Health

## 2024-08-25 DIAGNOSIS — F411 Generalized anxiety disorder: Secondary | ICD-10-CM

## 2024-08-25 DIAGNOSIS — G4700 Insomnia, unspecified: Secondary | ICD-10-CM | POA: Diagnosis not present

## 2024-08-25 DIAGNOSIS — F339 Major depressive disorder, recurrent, unspecified: Secondary | ICD-10-CM
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
## 2024-08-31 ENCOUNTER — Other Ambulatory Visit: Payer: Self-pay

## 2024-08-31 DIAGNOSIS — G8911 Acute pain due to trauma: Secondary | ICD-10-CM

## 2024-09-13 ENCOUNTER — Ambulatory Visit: Admission: RE | Admit: 2024-09-13 | Discharge: 2024-09-13 | Disposition: A | Source: Ambulatory Visit

## 2024-09-13 DIAGNOSIS — G8911 Acute pain due to trauma: Secondary | ICD-10-CM

## 2024-09-13 MED ORDER — GADOPICLENOL 0.5 MMOL/ML IV SOLN
7.0000 mL | Freq: Once | INTRAVENOUS | Status: AC | PRN
  Administered 2024-09-13: 7 mL via INTRAVENOUS
